# Patient Record
Sex: Female | Born: 1960 | Race: White | Hispanic: No | Smoking: Never smoker
Health system: Southern US, Community
[De-identification: ages and names within clinical notes are randomized; demographics above are authoritative.]

## PROBLEM LIST (undated history)

## (undated) DIAGNOSIS — I10 Essential (primary) hypertension: Secondary | ICD-10-CM

## (undated) DIAGNOSIS — J45909 Unspecified asthma, uncomplicated: Secondary | ICD-10-CM

## (undated) DIAGNOSIS — H348192 Central retinal vein occlusion, unspecified eye, stable: Secondary | ICD-10-CM

## (undated) HISTORY — PX: BACK SURGERY: SHX140

## (undated) HISTORY — PX: KNEE SURGERY: SHX244

## (undated) HISTORY — PX: ABDOMINAL HYSTERECTOMY: SHX81

---

## 1998-04-19 ENCOUNTER — Ambulatory Visit (HOSPITAL_BASED_OUTPATIENT_CLINIC_OR_DEPARTMENT_OTHER): Admission: RE | Admit: 1998-04-19 | Discharge: 1998-04-19 | Payer: Self-pay | Admitting: *Deleted

## 1998-07-29 ENCOUNTER — Other Ambulatory Visit: Admission: RE | Admit: 1998-07-29 | Discharge: 1998-07-29 | Payer: Self-pay | Admitting: *Deleted

## 1999-09-22 ENCOUNTER — Other Ambulatory Visit: Admission: RE | Admit: 1999-09-22 | Discharge: 1999-09-22 | Payer: Self-pay | Admitting: *Deleted

## 2000-10-26 ENCOUNTER — Other Ambulatory Visit: Admission: RE | Admit: 2000-10-26 | Discharge: 2000-10-26 | Payer: Self-pay | Admitting: *Deleted

## 2001-10-27 ENCOUNTER — Other Ambulatory Visit: Admission: RE | Admit: 2001-10-27 | Discharge: 2001-10-27 | Payer: Self-pay | Admitting: Gynecology

## 2002-02-14 ENCOUNTER — Encounter: Admission: RE | Admit: 2002-02-14 | Discharge: 2002-02-14 | Payer: Self-pay | Admitting: Internal Medicine

## 2002-02-14 ENCOUNTER — Encounter: Payer: Self-pay | Admitting: Internal Medicine

## 2002-02-16 ENCOUNTER — Encounter: Payer: Self-pay | Admitting: Internal Medicine

## 2002-02-16 ENCOUNTER — Encounter: Admission: RE | Admit: 2002-02-16 | Discharge: 2002-02-16 | Payer: Self-pay | Admitting: Internal Medicine

## 2002-02-22 ENCOUNTER — Encounter: Payer: Self-pay | Admitting: Neurosurgery

## 2002-02-22 ENCOUNTER — Ambulatory Visit (HOSPITAL_COMMUNITY): Admission: RE | Admit: 2002-02-22 | Discharge: 2002-02-23 | Payer: Self-pay | Admitting: Neurosurgery

## 2002-10-31 ENCOUNTER — Other Ambulatory Visit: Admission: RE | Admit: 2002-10-31 | Discharge: 2002-10-31 | Payer: Self-pay | Admitting: *Deleted

## 2003-10-29 ENCOUNTER — Other Ambulatory Visit: Admission: RE | Admit: 2003-10-29 | Discharge: 2003-10-29 | Payer: Self-pay | Admitting: *Deleted

## 2003-12-31 ENCOUNTER — Other Ambulatory Visit: Admission: RE | Admit: 2003-12-31 | Discharge: 2003-12-31 | Payer: Self-pay | Admitting: *Deleted

## 2004-07-16 ENCOUNTER — Other Ambulatory Visit: Admission: RE | Admit: 2004-07-16 | Discharge: 2004-07-16 | Payer: Self-pay | Admitting: *Deleted

## 2005-06-25 ENCOUNTER — Encounter: Admission: RE | Admit: 2005-06-25 | Discharge: 2005-06-25 | Payer: Self-pay | Admitting: Internal Medicine

## 2005-08-24 ENCOUNTER — Other Ambulatory Visit: Admission: RE | Admit: 2005-08-24 | Discharge: 2005-08-24 | Payer: Self-pay | Admitting: *Deleted

## 2005-11-16 ENCOUNTER — Other Ambulatory Visit: Admission: RE | Admit: 2005-11-16 | Discharge: 2005-11-16 | Payer: Self-pay | Admitting: *Deleted

## 2005-12-11 ENCOUNTER — Ambulatory Visit (HOSPITAL_COMMUNITY): Admission: RE | Admit: 2005-12-11 | Discharge: 2005-12-11 | Payer: Self-pay | Admitting: Gastroenterology

## 2016-03-11 ENCOUNTER — Ambulatory Visit (INDEPENDENT_AMBULATORY_CARE_PROVIDER_SITE_OTHER): Payer: 59

## 2016-03-11 ENCOUNTER — Encounter: Payer: Self-pay | Admitting: Podiatry

## 2016-03-11 ENCOUNTER — Ambulatory Visit (INDEPENDENT_AMBULATORY_CARE_PROVIDER_SITE_OTHER): Payer: 59 | Admitting: Podiatry

## 2016-03-11 VITALS — BP 131/87 | HR 72 | Resp 12

## 2016-03-11 DIAGNOSIS — M205X1 Other deformities of toe(s) (acquired), right foot: Secondary | ICD-10-CM

## 2016-03-11 DIAGNOSIS — M79671 Pain in right foot: Secondary | ICD-10-CM

## 2016-03-11 DIAGNOSIS — G588 Other specified mononeuropathies: Secondary | ICD-10-CM

## 2016-03-11 DIAGNOSIS — G5731 Lesion of lateral popliteal nerve, right lower limb: Secondary | ICD-10-CM | POA: Diagnosis not present

## 2016-03-11 MED ORDER — DEXAMETHASONE SODIUM PHOSPHATE 120 MG/30ML IJ SOLN
4.0000 mg | Freq: Once | INTRAMUSCULAR | Status: AC
  Administered 2016-03-11: 4 mg via INTRA_ARTICULAR

## 2016-03-11 NOTE — Patient Instructions (Signed)
The great toe joint has beginning arthritic changes and we are going to use a custom foot orthotic with a turf toe extension to reduce the flexion extension of the great toe joint. The local nerve in or around a great toe joint was injected with a local steroid to see if it will reduce the shooting pain in the area Will contact you when we have the orthotics are returned from the lab   Hallux Rigidus Hallux rigidus is a condition involving pain and a loss of motion of the first (big) toe. The pain gets worse with lifting up (extension) of the toe. This is usually due to arthritic bony bumps (spurring) of the joint at the base of the big toe.  SYMPTOMS   Pain, with lifting up of the toe.  Tenderness over the joint where the big toe meets the foot.  Redness, swelling, and warmth over the top of the base of the big toe (sometimes).  Foot pain, stiffness, and limping. CAUSES  Hallux rigidus is caused by arthritis of the joint where the big toe meets the foot. The arthritis creates a bone spur that pinches the soft tissues when the toe is extended. RISK INCREASES WITH:  Tight shoes with a narrow toe box.  Family history of foot problems.  Gout and rheumatoid and psoriatic arthritis.  History of previous toe injury, including "turf toe."  Long first toe, flat feet, and other big toe bony bumps.  Arthritis of the big toe. PREVENTION   Wear wide-toed shoes that fit well.  Tape the big toe to reduce motion and to prevent pinching of the tissues between the bone.  Maintain physical fitness:  Foot and ankle flexibility.  Muscle strength and endurance. PROGNOSIS  This condition can usually be managed with proper treatment. However, surgery is typically required to prevent the problem from recurring.  RELATED COMPLICATIONS  Injury to other areas of the foot or ankle, caused by abnormal walking in an attempt to avoid the pain felt when walking normally. TREATMENT Treatment first  involves stopping the activities that aggravate your symptoms. Ice and medicine can be used to reduce the pain and inflammation. Modifications to shoes may help reduce pain, including wearing stiff-soled shoes, shoes with a wide toe box, inserting a padded donut to relieve pressure on top of the joint, or wearing an arch support. Corticosteroid injections may be given to reduce inflammation. If nonsurgical treatment is unsuccessful, surgery may be needed. Surgical options include removing the arthritic bony spur, cutting a bone in the foot to change the arc of motion (allowing the toe to extend more), or fusion of the joint (eliminating all motion in the joint at the base of the big toe).  MEDICATION   If pain medicine is needed, nonsteroidal anti-inflammatory medicines (aspirin and ibuprofen), or other minor pain relievers (acetaminophen), are often advised.  Do not take pain medicine for 7 days before surgery.  Prescription pain relievers are usually prescribed only after surgery. Use only as directed and only as much as you need.  Ointments for arthritis, applied to the skin, may give some relief.  Injections of corticosteroids may be given to reduce inflammation. HEAT AND COLD  Cold treatment (icing) relieves pain and reduces inflammation. Cold treatment should be applied for 10 to 15 minutes every 2 to 3 hours, and immediately after activity that aggravates your symptoms. Use ice packs or an ice massage.  Heat treatment may be used before performing the stretching and strengthening activities prescribed by  your caregiver, physical therapist, or athletic trainer. Use a heat pack or a warm water soak. SEEK MEDICAL CARE IF:   Symptoms get worse or do not improve in 2 weeks, despite treatment.  After surgery you develop fever, increasing pain, redness, swelling, drainage of fluids, bleeding, or increasing warmth.  New, unexplained symptoms develop. (Drugs used in treatment may produce side  effects.)   This information is not intended to replace advice given to you by your health care provider. Make sure you discuss any questions you have with your health care provider.   Document Released: 09/28/2005 Document Revised: 10/19/2014 Document Reviewed: 01/10/2009 Elsevier Interactive Patient Education [employer]

## 2016-03-11 NOTE — Progress Notes (Signed)
Subjective:    Patient ID: Regina Ramirez, female    DOB: 07-13-1961, 55 y.o.   MRN: [id number]  HPI  E This patient presents today for 2 concerns. She describes a throbbing sensation in or around her right first MPJ area that is activated with weightbearing with flexion extension of the great toe. The throbbing is quite debilitating and patient describes this discomfort often lasting up to about an hour relieved with relative rest and elevation. This throbbing around her great toe joint is proportionate to her physical activity. Intermittently she has sharp episodes of the stinging-like pain that can occur on and off weightbearing in around the dorsal medial first MPJ area not directly related to physical activity or with flexion extension of the joint. Patient has history of turf toe injury some several years ago, however, these symptoms are more recent. Patient has sedentary job and can control the throbbing because of relatively inactivity during work. She adjust her activity to accommodate to the throbbing.  Review of Systems  Musculoskeletal: Positive for back pain, joint swelling and gait problem.       Objective:   Physical Exam  Orientated 3  Vascular: No peripheral edema bilaterally DP and PT pulses 2/4 bilaterally Capillary reflex immediate bilaterally  Neurological: Sensation to 10 g monofilament wire intact 5/5 bilaterally Vibratory sensation reactive bilaterally Ankle reflex equal and reactive bilaterally Palpation dorsal medial first MPJ over superficial peroneal nerve causes dysesthesias ( sharp pain) duplicating patient's sharp discomfort  Musculoskeletal: Right first MPJ has no obvious restriction, however pain upon range of motion dorsi and plantar flexion duplicating patient's discomfort in around the right great toe joint area. 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 I made patient aware that the sharp pain most likely was associated with a local nerve in that area and offered her a local injection of dexamethasone phosphate with the attempt of reducing some of the symptoms. Patient verbally consents to this injection. The skin is prepped with alcohol and Betadine and 4 mg of dexamethasone phosphate and 2.5 mg of plain Sensorcaine were injected along the course of the medial superficial perineal nerve. H&N no difficulty tolerating procedure  I discussed the pain upon range of motion and the first MPJ associated with a relatively long first metatarsal and beginning low-grade osteoarthritis and recommended a orthotic with a turf toe extension to reduce flexion extension the first MPJ. I made patient aware that the discomfort on the first MPJ could become more symptomatic and progressive over time, however,I thought the most conservative beginning treatment would be the accommodative orthotic Patient consents to scan for custom orthotics  Digital scan obtained today for in shell orthotic with extrinsic focused on the rear foot and intrinsic forefoot post with turf toe extension on  right.  Return patient for dispensing of orthotics

## 2016-04-01 ENCOUNTER — Ambulatory Visit: Payer: 59 | Admitting: *Deleted

## 2016-04-01 DIAGNOSIS — M79671 Pain in right foot: Secondary | ICD-10-CM

## 2016-04-01 NOTE — Patient Instructions (Signed)

## 2016-04-01 NOTE — Progress Notes (Signed)
Patient ID: Rollene RotundaElizabeth F Mrozek, female   DOB: 09/04/1961, 55 y.o.   MRN: [id number] Patient presents for orthotic pick up.  Verbal and written break in and wear instructions given.  Patient will follow up in 4 weeks if symptoms worsen or fail to improve.

## 2018-12-25 ENCOUNTER — Encounter: Payer: Self-pay | Admitting: Emergency Medicine

## 2018-12-25 ENCOUNTER — Emergency Department (HOSPITAL_COMMUNITY)
Admission: EM | Admit: 2018-12-25 | Discharge: 2018-12-26 | Disposition: A | Discharge status: Home / Self Care | Payer: 59 | Attending: Emergency Medicine | Admitting: Emergency Medicine

## 2018-12-25 ENCOUNTER — Other Ambulatory Visit: Payer: Self-pay

## 2018-12-25 DIAGNOSIS — M5441 Lumbago with sciatica, right side: Secondary | ICD-10-CM

## 2018-12-25 DIAGNOSIS — M545 Low back pain: Secondary | ICD-10-CM | POA: Diagnosis present

## 2018-12-25 HISTORY — DX: Essential (primary) hypertension: I10

## 2018-12-25 HISTORY — DX: Central retinal vein occlusion, unspecified eye, stable: H34.8192

## 2018-12-25 MED ORDER — KETOROLAC TROMETHAMINE 30 MG/ML IJ SOLN
30.0000 mg | Freq: Once | INTRAMUSCULAR | Status: AC
  Administered 2018-12-25: 30 mg via INTRAVENOUS
  Filled 2018-12-25: qty 1

## 2018-12-25 MED ORDER — METHOCARBAMOL 1000 MG/10ML IJ SOLN
500.0000 mg | Freq: Once | INTRAVENOUS | Status: AC
  Administered 2018-12-26: 500 mg via INTRAVENOUS
  Filled 2018-12-25: qty 5

## 2018-12-25 MED ORDER — HYDROMORPHONE HCL 1 MG/ML IJ SOLN
1.0000 mg | Freq: Once | INTRAMUSCULAR | Status: AC
  Administered 2018-12-25: 1 mg via INTRAVENOUS
  Filled 2018-12-25: qty 1

## 2018-12-25 MED ORDER — DEXAMETHASONE SODIUM PHOSPHATE 10 MG/ML IJ SOLN
10.0000 mg | Freq: Once | INTRAMUSCULAR | Status: AC
  Administered 2018-12-25: 10 mg via INTRAVENOUS
  Filled 2018-12-25: qty 1

## 2018-12-25 MED ORDER — ONDANSETRON HCL 4 MG/2ML IJ SOLN
4.0000 mg | Freq: Once | INTRAMUSCULAR | Status: AC
  Administered 2018-12-25: 4 mg via INTRAVENOUS
  Filled 2018-12-25: qty 2

## 2018-12-25 NOTE — ED Provider Notes (Signed)
[facility] EMERGENCY DEPARTMENT Provider Note   CSN: [id number] Arrival date & time: 12/25/18  2128    History   Chief Complaint Chief Complaint  Patient presents with  . Back Pain    HPI Regina Ramirez is a 58 y.o. female.    58 year old female with a history of hypertension and prior ruptured disc status post back surgery by Dr. Franky Macho ~18 years ago presents to the emergency department for low back pain.  She reports some mild low back discomfort beginning Thursday.  She was passing a computer monitor to a coworker at [employer] when she turned and began to experience severe pain in her low back.  Pain was aggravated with ambulation.  She was able to maneuver herself to the couch where she laid for 1 hour without improvement to her discomfort.  States that pain radiates to her right hip and down her lateral right leg.  She called a friend at 69 who came to help her and called EMS.  She received 100 mcg fentanyl in route with mild improvement.  She has not had any genital or perianal numbness.  No bowel or bladder incontinence, extremity numbness or weakness, recent fevers.  States that her pain feels similar to her prior ruptured disc.  Denies any recent trauma, fall, direct injury to her back.  The history is provided by the patient. No language interpreter was used.  Back Pain    Past Medical History:  Diagnosis Date  . Hypertension   . Retinal vein occlusion     There are no active problems to display for this patient.   Past Surgical History:  Procedure Laterality Date  . BACK SURGERY       OB History   No obstetric history on file.      Home Medications    Prior to Admission medications   Medication Sig Start Date End Date Taking? Authorizing Provider  fexofenadine (ALLEGRA) 180 MG tablet Take by mouth.    [provider]  lisinopril-hydrochlorothiazide (ZESTORETIC) 10-12.5 MG tablet Take by mouth. 02/24/16 02/23/17  [provider]  methocarbamol (ROBAXIN) 500 MG tablet Take 1 tablet (500 mg total) by mouth every 8 (eight) hours as needed for muscle spasms. 12/26/18   Antony Madura, PA-C  oxyCODONE-acetaminophen (PERCOCET/ROXICET) 5-325 MG tablet Take 1-2 tablets by mouth every 6 (six) hours as needed for severe pain. 12/26/18   Antony Madura, PA-C  predniSONE (DELTASONE) 20 MG tablet Take 3 tablets (60 mg total) by mouth daily for 4 days, THEN 2 tablets (40 mg total) daily for 4 days, THEN 1 tablet (20 mg total) daily for 4 days, THEN 0.5 tablets (10 mg total) daily for 4 days. 12/26/18 01/11/19  Antony Madura, PA-C  Triamcinolone Acetonide (NASACORT AQ NA) Place into the nose.    [provider]  Vitamin D, Ergocalciferol, (DRISDOL) 50000 units CAPS capsule Take by mouth. 11/26/15   [provider]    Family History History reviewed. No pertinent family history.  Social History Social History   Tobacco Use  . Smoking status: Never Smoker  . Smokeless tobacco: Never Used  Substance Use Topics  . Alcohol use: No    Alcohol/week: 0.0 standard drinks  . Drug use: No     Allergies   Patient has no known allergies.   Review of Systems Review of Systems  Musculoskeletal: Positive for back pain.  Ten systems reviewed and are negative for acute change, except as noted in the  HPI.    Physical Exam Updated Vital Signs BP (!) 149/98   Pulse 69   Temp 97.9 F (36.6 C) (Oral)   Resp 16   Ht 5' 6.5" (1.689 m)   Wt 89.4 kg   SpO2 100%   BMI 31.32 kg/m   Physical Exam Vitals signs and nursing note reviewed.  Constitutional:      General: She is not in acute distress.    Appearance: She is well-developed. She is not diaphoretic.     Comments: Nontoxic appearing and in NAD  HENT:     Head: Normocephalic and atraumatic.  Eyes:     General: No scleral icterus.    Conjunctiva/sclera: Conjunctivae normal.  Neck:     Musculoskeletal: Normal range of motion.  Cardiovascular:      Rate and Rhythm: Normal rate and regular rhythm.     Pulses: Normal pulses.     Comments: DP pulse 2+ bilaterally Pulmonary:     Effort: Pulmonary effort is normal. No respiratory distress.     Comments: Respirations even and unlabored Musculoskeletal:        General: Tenderness present.     Lumbar back: She exhibits tenderness and pain. She exhibits no bony tenderness and no spasm.       Back:     Comments: Tenderness to palpation of the right lumbosacral paraspinal muscles.  No bony deformities, step-offs, crepitus to the lumbosacral midline.  Positive straight leg raise on the right.  Negative crossed straight leg raise.  Skin:    General: Skin is warm and dry.     Coloration: Skin is not pale.     Findings: No erythema or rash.  Neurological:     Mental Status: She is alert and oriented to person, place, and time.     Comments: Sensation to light touch intact in bilateral lower extremities.  Reflexes equal in BLE.  Psychiatric:        Behavior: Behavior normal.      ED Treatments / Results  Labs (all labs ordered are listed, but only abnormal results are displayed) Labs Reviewed - No data to display  EKG None  Radiology No results found.  Procedures Procedures (including critical care time)  Medications Ordered in ED Medications  ketorolac (TORADOL) 30 MG/ML injection 30 mg (30 mg Intravenous Given 12/25/18 2312)  HYDROmorphone (DILAUDID) injection 1 mg (1 mg Intravenous Given 12/25/18 2312)  ondansetron (ZOFRAN) injection 4 mg (4 mg Intravenous Given 12/25/18 2312)  methocarbamol (ROBAXIN) 500 mg in dextrose 5 % 50 mL IVPB (0 mg Intravenous Stopped 12/26/18 0106)  dexamethasone (DECADRON) injection 10 mg (10 mg Intravenous Given 12/25/18 2312)  oxyCODONE-acetaminophen (PERCOCET/ROXICET) 5-325 MG per tablet 2 tablet (2 tablets Oral Given 12/26/18 0217)    12:12 AM Patient with improved pain at rest. Still aggravated with movement. IV Robaxin infusing.   1:40 AM  Patient with improvement in pain. BP 130/82. HTN due to pain response. Will give 2 percocet for additional pain control and attempt ambulation.  2:53 AM Patient able to ambulate to the bathroom.  Antalgic gait noted, but able to weight-bear with minimal assistance.  2:55 AM [employer] substance database reviewed.  No narcotics prescribed since January 2019.   Initial Impression / Assessment and Plan / ED Course  I have reviewed the triage vital signs and the nursing notes.  Pertinent labs & imaging results that were available during my care of the patient were reviewed by me and considered in my medical decision  making (see chart for details).        58 year old female presents to the emergency department for back pain.  Had acute worsening of symptoms when passing a computer monitor to a coworker.  She had previously been experiencing low back discomfort since Thursday.  Noted to be neurovascularly intact on arrival.  Pain aggravated by position change.  Her symptoms are consistent with sciatica with radicular pain down her right leg.  No red flags or signs concerning for cauda equina.  No indication for emergent imaging at this time.  The patient was medically managed with steroids, NSAIDs, IV Robaxin, and pain medicine.  She has been able to ambulate with some improvement in her pain.  I have advised that she follow-up with neurosurgery on an outpatient basis given her history.  Return precautions discussed and provided.  Patient discharged in stable condition with no unaddressed concerns.   Final Clinical Impressions(s) / ED Diagnoses   Final diagnoses:  Acute right-sided low back pain with right-sided sciatica    ED Discharge Orders         Ordered    methocarbamol (ROBAXIN) 500 MG tablet  Every 8 hours PRN     12/26/18 0257    oxyCODONE-acetaminophen (PERCOCET/ROXICET) 5-325 MG tablet  Every 6 hours PRN     12/26/18 0257    predniSONE (DELTASONE) 20 MG tablet      12/26/18 0257           Antony Madura, PA-C 12/26/18 0430    Charlynne Pander, MD 12/27/18 1122

## 2018-12-25 NOTE — ED Triage Notes (Signed)
Pt presents with onset of lower back pain while picking up a computer monitor at 1815.  Pt reports pain radiates into R hip and down R  Leg.  Pt received fentanyl 100mg  PIV. Pt denies any bowel or bladder incontinence.

## 2018-12-26 MED ORDER — METHOCARBAMOL 500 MG PO TABS: 500.0000 mg | ORAL_TABLET | Freq: Three times a day (TID) | ORAL | 0 refills | Status: DC | PRN

## 2018-12-26 MED ORDER — OXYCODONE-ACETAMINOPHEN 5-325 MG PO TABS
2.0000 | ORAL_TABLET | Freq: Once | ORAL | Status: AC
  Administered 2018-12-26: 2 via ORAL
  Filled 2018-12-26: qty 2

## 2018-12-26 MED ORDER — OXYCODONE-ACETAMINOPHEN 5-325 MG PO TABS: 1.0000 | ORAL_TABLET | Freq: Four times a day (QID) | ORAL | 0 refills | Status: DC | PRN

## 2018-12-26 MED ORDER — PREDNISONE 20 MG PO TABS: ORAL_TABLET | ORAL | 0 refills | Status: AC

## 2018-12-26 NOTE — ED Notes (Signed)
Pt ambulated to the bathroom and back with some pain.

## 2018-12-26 NOTE — Discharge Instructions (Signed)
Avoid strenuous activity and heavy lifting.  Take prednisone as prescribed until finished.  You have been given a prescription for Robaxin for management of muscle spasms as well as Percocet for pain control.  Do not drive or drink alcohol after taking these medications as they may make you drowsy and impair your judgment.  We recommend close follow-up with your primary care doctor as well as follow-up with a neurosurgeon given your history of back surgery and ruptured disc.  You may return to the ED for new or concerning symptoms, especially if you develop incontinence of bladder or bowel function, numbness of your genital region, or fever over 100.1F

## 2018-12-26 NOTE — ED Notes (Signed)
Patient verbalizes understanding of discharge instructions. Opportunity for questioning and answers were provided. Armband removed by staff, pt discharged from ED.  

## 2018-12-29 ENCOUNTER — Other Ambulatory Visit: Payer: Self-pay | Admitting: Family Medicine

## 2018-12-29 DIAGNOSIS — M5441 Lumbago with sciatica, right side: Secondary | ICD-10-CM

## 2019-01-03 ENCOUNTER — Other Ambulatory Visit: Payer: Self-pay

## 2019-01-03 ENCOUNTER — Ambulatory Visit
Admission: RE | Admit: 2019-01-03 | Discharge: 2019-01-03 | Disposition: A | Discharge status: Home / Self Care | Payer: 59 | Source: Ambulatory Visit | Attending: Family Medicine | Admitting: Family Medicine

## 2019-01-03 DIAGNOSIS — M5441 Lumbago with sciatica, right side: Secondary | ICD-10-CM

## 2019-01-10 ENCOUNTER — Other Ambulatory Visit: Payer: Self-pay | Admitting: Family Medicine

## 2019-04-13 ENCOUNTER — Ambulatory Visit
Admission: EM | Admit: 2019-04-13 | Discharge: 2019-04-13 | Disposition: A | Discharge status: Home / Self Care | Payer: 59 | Attending: Physician Assistant | Admitting: Physician Assistant

## 2019-04-13 ENCOUNTER — Ambulatory Visit (INDEPENDENT_AMBULATORY_CARE_PROVIDER_SITE_OTHER): Payer: 59

## 2019-04-13 ENCOUNTER — Other Ambulatory Visit: Payer: Self-pay

## 2019-04-13 DIAGNOSIS — M79672 Pain in left foot: Secondary | ICD-10-CM

## 2019-04-13 DIAGNOSIS — I10 Essential (primary) hypertension: Secondary | ICD-10-CM

## 2019-04-13 NOTE — ED Provider Notes (Signed)
[facility]    CSN: [id number] Arrival date & time: 04/13/19  1553     History   Chief Complaint Chief Complaint  Patient presents with  . Foot Injury    HPI Regina Ramirez is a 58 y.o. female.   58 year old female comes in for few hour history of left foot pain after injury.  States she inverted her ankle.  Has swelling and contusion to the lateral side of the foot.  No pain at rest, pain with weightbearing and dorsiflexion of the foot.  Denies numbness, tingling, radiation of pain.  Has not taken anything for the symptoms.     Past Medical History:  Diagnosis Date  . Hypertension   . Retinal vein occlusion     There are no active problems to display for this patient.   Past Surgical History:  Procedure Laterality Date  . BACK SURGERY      OB History   No obstetric history on file.      Home Medications    Prior to Admission medications   Medication Sig Start Date End Date Taking? Authorizing Provider  fexofenadine (ALLEGRA) 180 MG tablet Take by mouth.    [provider]  lisinopril-hydrochlorothiazide (ZESTORETIC) 10-12.5 MG tablet Take by mouth. 02/24/16 02/23/17  [provider]  methocarbamol (ROBAXIN) 500 MG tablet Take 1 tablet (500 mg total) by mouth every 8 (eight) hours as needed for muscle spasms. 12/26/18   Antony MaduraHumes, Kelly, PA-C  oxyCODONE-acetaminophen (PERCOCET/ROXICET) 5-325 MG tablet Take 1-2 tablets by mouth every 6 (six) hours as needed for severe pain. 12/26/18   Antony MaduraHumes, Kelly, PA-C  Triamcinolone Acetonide (NASACORT AQ NA) Place into the nose.    [provider]  Vitamin D, Ergocalciferol, (DRISDOL) 50000 units CAPS capsule Take by mouth. 11/26/15   [provider]    Family History No family history on file.  Social History Social History   Tobacco Use  . Smoking status: Never Smoker  . Smokeless tobacco: Never Used  Substance Use Topics  . Alcohol use: No    Alcohol/week: 0.0  standard drinks  . Drug use: No     Allergies   Patient has no known allergies.   Review of Systems Review of Systems  Reason unable to perform ROS: See HPI as above.     Physical Exam Triage Vital Signs ED Triage Vitals  Enc Vitals Group     BP 04/13/19 1605 (!) 173/99     Pulse Rate 04/13/19 1605 72     Resp 04/13/19 1605 18     Temp 04/13/19 1605 98.2 F (36.8 C)     Temp Source 04/13/19 1605 Oral     SpO2 04/13/19 1605 97 %     Weight --      Height --      Head Circumference --      Peak Flow --      Pain Score 04/13/19 1606 2     Pain Loc --      Pain Edu? --      Excl. in GC? --    No data found.  Updated Vital Signs BP (!) 173/99 (BP Location: Left Arm)   Pulse 72   Temp 98.2 F (36.8 C) (Oral)   Resp 18   SpO2 97%   Physical Exam Constitutional:      General: She is not in acute distress.    Appearance: She is well-developed. She is not diaphoretic.  HENT:  Head: Normocephalic and atraumatic.  Eyes:     Conjunctiva/sclera: Conjunctivae normal.     Pupils: Pupils are equal, round, and reactive to light.  Musculoskeletal:     Comments: Swelling with contusion along the fourth and fifth MTP of left foot.  No tenderness to palpation of bilateral malleolus.  Tenderness to palpation of proximal fifth MTP.  Tenderness to palpation of distal fourth MTP.  Full range of motion of ankle, decreased dorsiflexion of the ankle.  Normal inversion, eversion, extension.  Strength normal and equal bilaterally.  Sensation intact ankle bilaterally.  Pedal pulse 2+.  Skin:    General: Skin is warm and dry.  Neurological:     Mental Status: She is alert and oriented to person, place, and time.    UC Treatments / Results  Labs (all labs ordered are listed, but only abnormal results are displayed) Labs Reviewed - No data to display  EKG   Radiology Dg Foot Complete Left  Result Date: 04/13/2019 CLINICAL DATA:  Left foot pain. Pain is primarily on the fifth  metatarsal. Bruising noted to the fourth metatarsophalangeal joint. There is a laceration at the fifth metatarsophalangeal joint. EXAM: LEFT FOOT - COMPLETE 3+ VIEW COMPARISON:  None. FINDINGS: There is soft tissue swelling about the foot, most notably about the fifth metatarsophalangeal joint. There is no radiopaque foreign body. There is no acute displaced fracture or dislocation. There is a small osseous fragment at the level of the calcaneus, only visualized on the frontal view. This is favored to represent sequela of an old remote injury. There are a few osseous fragments adjacent to the cuboid, likely secondary to an old remote injury. There are degenerative changes of the first metatarsophalangeal joint. IMPRESSION: Soft tissue swelling about the foot with no evidence of an acute displaced fracture or dislocation. Electronically Signed   By: Constance Holster M.D.   On: 04/13/2019 16:53    Procedures Procedures (including critical care time)  Medications Ordered in UC Medications - No data to display  Initial Impression / Assessment and Plan / UC Course  I have reviewed the triage vital signs and the nursing notes.  Pertinent labs & imaging results that were available during my care of the patient were reviewed by me and considered in my medical decision making (see chart for details).    X-ray negative for fracture or dislocation.  NSAIDs, ice compress, elevation, Ace wrap during activity.  Return precautions given.  Patient expresses understanding and agrees to plan.   Final Clinical Impressions(s) / UC Diagnoses   Final diagnoses:  Left foot pain    ED Prescriptions    None        Ok Edwards, PA-C 04/13/19 1724

## 2019-04-13 NOTE — ED Triage Notes (Signed)
Pt states stepped off her porch wrong, twisting lt foot. Swelling and bruising noted

## 2019-04-13 NOTE — Discharge Instructions (Signed)
X-ray negative for fracture or dislocation.  Ibuprofen 600 to 800 mg 3 times a day.  Ice compress, elevation, Ace wrap during activity.  This can take a few weeks to completely resolve, but should be feeling better each week.  Follow-up with PCP for further evaluation if symptoms not improving.

## 2019-05-10 ENCOUNTER — Encounter: Payer: Self-pay | Admitting: Orthopedic Surgery

## 2019-05-10 ENCOUNTER — Ambulatory Visit: Payer: Self-pay

## 2019-05-10 ENCOUNTER — Ambulatory Visit (INDEPENDENT_AMBULATORY_CARE_PROVIDER_SITE_OTHER): Payer: 59 | Admitting: Orthopedic Surgery

## 2019-05-10 DIAGNOSIS — M25561 Pain in right knee: Secondary | ICD-10-CM

## 2019-05-10 NOTE — Progress Notes (Signed)
Office Visit Note   Patient: Regina Ramirez           Date of Birth: [birth year]           MRN: [id number] Visit Date: 05/10/2019 Requested by: Medicine, [place],  [place] [address] PCP: Medicine, [facility]  Subjective: Chief Complaint  Patient presents with  . Right Knee - Pain    HPI: Regina Ramirez is a 58 y.o. female who presents to the office complaining of R knee numbness.  Patient states that her symptoms began when she had a herniated disc in March 2020.  Subsequently, her entire right leg was numb.  She was seen by Dr. Cyndy Freeze and she had surgery in early April 2020 for a herniated disc.  Patient states that the majority of her numbness and tingling improved the day after surgery but she has been left with 1 area of persistent numbness and tingling that extends from her medial distal thigh to her medial proximal calf.  Patient also notes weakness and states that she does not exactly trust her leg.  Patient denies any pain at all.  Patient states that her weakness has seemed to have been improving since the surgery slowly.              ROS: All systems reviewed are negative as they relate to the chief complaint within the history of present illness.  Patient denies  fevers or chills.   Assessment & Plan: Visit Diagnoses:  1. Right knee pain, unspecified chronicity     Plan: Patient is a 58 year old female who presents following surgery for a herniated disc.  Her symptoms include weakness of the hip flexors and numbness tingling of the medial thigh to calf.  The weakness is most concerning but that has been improving.  Recommended the patient try stationary bike to improve her hip flexor strength.  Discussed the potential that her numbness and tingling may improve with time or may never return.  If her weakness persists, will explore the possibility of a recurrent herniation with an MRI scan  in 2 months.  Patient will follow-up in 2 months with this office.  Patient agrees with plan.  Follow-Up Instructions: No follow-ups on file.   Orders:  Orders Placed This Encounter  Procedures  . XR Knee 1-2 Views Right   No orders of the defined types were placed in this encounter.     Procedures: No procedures performed   Clinical Data: No additional findings.  Objective: Vital Signs: There were no vitals taken for this visit.  Physical Exam:   Constitutional: Patient appears well-developed HEENT:  Head: Normocephalic Eyes:EOM are normal Neck: Normal range of motion Cardiovascular: Normal rate Pulmonary/chest: Effort normal Neurologic: Patient is alert Skin: Skin is warm Psychiatric: Patient has normal mood and affect    Ortho Exam:  No effusion Extensor mechanism intact No TTP over the medial or lateral jointlines, quad tendon, patellar tendon, pes anserinus, patella, tibial tubercle, LCL/MCL insertions Extension to 0 degrees Flexion > 90 degrees Area of reduced sensation of the medial right leg that extends from the distal thigh to the proximal calf.  Full sensation distal to this area.  Full sensation of the posterior and lateral knee. Marked weakness of the right hip flexor and the right quad when compared to the contralateral side.  Equivalent strength bilaterally for dorsiflexion and plantar flexion.   Specialty Comments:  No specialty comments  available.  Imaging: No results found.   PMFS History: There are no active problems to display for this patient.  Past Medical History:  Diagnosis Date  . Hypertension   . Retinal vein occlusion     History reviewed. No pertinent family history.  Past Surgical History:  Procedure Laterality Date  . BACK SURGERY     Social History   Occupational History  . Not on file  Tobacco Use  . Smoking status: Never Smoker  . Smokeless tobacco: Never Used  Substance and Sexual Activity  . Alcohol use: No     Alcohol/week: 0.0 standard drinks  . Drug use: No  . Sexual activity: Not on file

## 2019-07-12 ENCOUNTER — Other Ambulatory Visit: Payer: Self-pay

## 2019-07-12 ENCOUNTER — Encounter: Payer: Self-pay | Admitting: Orthopedic Surgery

## 2019-07-12 ENCOUNTER — Ambulatory Visit (INDEPENDENT_AMBULATORY_CARE_PROVIDER_SITE_OTHER): Payer: 59 | Admitting: Orthopedic Surgery

## 2019-07-12 ENCOUNTER — Ambulatory Visit (INDEPENDENT_AMBULATORY_CARE_PROVIDER_SITE_OTHER): Payer: 59

## 2019-07-12 VITALS — Ht 66.5 in | Wt 197.0 lb

## 2019-07-12 DIAGNOSIS — M545 Low back pain, unspecified: Secondary | ICD-10-CM

## 2019-07-12 DIAGNOSIS — R29898 Other symptoms and signs involving the musculoskeletal system: Secondary | ICD-10-CM

## 2019-07-12 DIAGNOSIS — G8929 Other chronic pain: Secondary | ICD-10-CM | POA: Diagnosis not present

## 2019-07-14 ENCOUNTER — Encounter: Payer: Self-pay | Admitting: Orthopedic Surgery

## 2019-07-14 NOTE — Progress Notes (Signed)
Office Visit Note   Patient: Regina Ramirez           Date of Birth: 1961/09/10           MRN: [id number] Visit Date: 07/12/2019 Requested by: Medicine, [facility] [address] [place],  [place] [address] PCP: Medicine, [facility]  Subjective: Chief Complaint  Patient presents with  . Lower Back - Follow-up    HPI: Regina Ramirez is a 58 y.o. female who presents to the office complaining of right knee numbness and back pain.  Patient returns for 38-month follow-up appointment.  Patient notes continued medial right knee numbness with no improvement over the past 2 months.  She has been walking to improve her strength has a stationary bike was too expensive.  She denies any radicular symptoms.  She does have some weakness but she is functional day-to-day, though she has not lifting anything these days.  Denies any instability of the right leg.               ROS:  All systems reviewed are negative as they relate to the chief complaint within the history of present illness.  Patient denies fevers or chills.  Assessment & Plan: Visit Diagnoses:  1. Chronic low back pain, unspecified back pain laterality, unspecified whether sciatica present   2. Weakness of right lower extremity     Plan: Patient is a 57 year old female who presents following back surgery in April.  Her symptoms have not improved in the past 2 months.  She continues to have weakness of the right leg compared to the left leg on exam.  Her persistent medial right knee numbness has not improved either.  Discussed options available to the patient, including ESI injections.  Ordered MRI of the lumbar spine to evaluate for recurrent disc herniation.  Patient will follow-up after MRI to review results.  Follow-Up Instructions: Return for after MRI.   Orders:  Orders Placed This Encounter  Procedures  . XR Lumbar Spine 2-3 Views  . MR Lumbar Spine w/o contrast    No orders of the defined types were placed in this encounter.     Procedures: No procedures performed   Clinical Data: No additional findings.  Objective: Vital Signs: Ht 5' 6.5" (1.689 m)   Wt 197 lb (89.4 kg)   BMI 31.32 kg/m   Physical Exam:  Constitutional: Patient appears well-developed HEENT:  Head: Normocephalic Eyes:EOM are normal Neck: Normal range of motion Cardiovascular: Normal rate Pulmonary/chest: Effort normal Neurologic: Patient is alert Skin: Skin is warm Psychiatric: Patient has normal mood and affect  Ortho Exam:  Right knee Exam No effusion Extensor mechanism intact No TTP over the medial or lateral jointlines, quad tendon, patellar tendon, pes anserinus, patella, tibial tubercle, LCL/MCL insertions Stable to varus/valgus stresses.  Stable to anterior/posterior drawer Extension to 0 degrees Flexion > 90 degrees Sensation intact throughout the RLE except for the medial knee from the distal femur to the proximal tibia  4/5 motor strength of right hip flexors, quadriceps, dorsiflexion, plantarflexion when compared to the contralateral side  Specialty Comments:  No specialty comments available.  Imaging: No results found.   PMFS History: There are no active problems to display for this patient.  Past Medical History:  Diagnosis Date  . Hypertension   . Retinal vein occlusion     History reviewed. No pertinent family history.  Past Surgical History:  Procedure Laterality Date  . BACK  SURGERY     Social History   Occupational History  . Not on file  Tobacco Use  . Smoking status: Never Smoker  . Smokeless tobacco: Never Used  Substance and Sexual Activity  . Alcohol use: No    Alcohol/week: 0.0 standard drinks  . Drug use: No  . Sexual activity: Not on file

## 2019-08-02 ENCOUNTER — Other Ambulatory Visit: Payer: Self-pay

## 2019-08-02 ENCOUNTER — Ambulatory Visit
Admission: RE | Admit: 2019-08-02 | Discharge: 2019-08-02 | Disposition: A | Discharge status: Home / Self Care | Payer: 59 | Source: Ambulatory Visit | Attending: Orthopedic Surgery | Admitting: Orthopedic Surgery

## 2019-08-02 DIAGNOSIS — G8929 Other chronic pain: Secondary | ICD-10-CM

## 2019-08-02 DIAGNOSIS — M545 Low back pain, unspecified: Secondary | ICD-10-CM

## 2019-08-09 ENCOUNTER — Ambulatory Visit: Payer: 59 | Admitting: Orthopedic Surgery

## 2019-08-11 ENCOUNTER — Ambulatory Visit (INDEPENDENT_AMBULATORY_CARE_PROVIDER_SITE_OTHER): Payer: 59 | Admitting: Orthopedic Surgery

## 2019-08-11 ENCOUNTER — Encounter: Payer: Self-pay | Admitting: Orthopedic Surgery

## 2019-08-11 VITALS — Ht 66.5 in | Wt 190.0 lb

## 2019-08-11 DIAGNOSIS — M5126 Other intervertebral disc displacement, lumbar region: Secondary | ICD-10-CM

## 2019-08-12 ENCOUNTER — Encounter: Payer: Self-pay | Admitting: Orthopedic Surgery

## 2019-08-12 NOTE — Progress Notes (Signed)
Office Visit Note   Patient: Regina Ramirez           Date of Birth: 1960/11/07           MRN: [id number] Visit Date: 08/11/2019 Requested by: Medicine, [place],  [place] [id number] PCP: Medicine, [facility]  Subjective: Chief Complaint  Patient presents with  . Lower Back - Follow-up    MRI results    HPI: Regina Ramirez is a 58 y.o. female who presents to the office complaining of right knee pain and numbness.  Patient returns to the office to discuss lumbar spine MRI results.  She notes continued right medial knee numbness that she has been complained about at her previous visits.  In addition to this she notes in the past 2 weeks she has been experiencing occasional bouts of back pain with radiation into her right buttocks that lasts for about 60 seconds.  She denies any new numbness or tingling.  She been taking ibuprofen as needed for this pain.                ROS:  All systems reviewed are negative as they relate to the chief complaint within the history of present illness.  Patient denies fevers or chills.  Assessment & Plan: Visit Diagnoses:  1. Lumbar disc herniation     Plan: Patient is a 58 year old female who returns to the office to discuss MRI results of the lumbar spine.  MRI of the L-spine reveals a shallow right extraforaminal disc protrusion at L2-L3 that may be affecting the exiting L2 nerve root.  My impression is that this is likely the source of her persistent numbness and may be contributing to her right leg pain.  She is not having any weakness on exam and subjectively feels that her leg is getting stronger.  Recommended patient follow-up with Dr. Ernestina Patches for epidural steroid injections of the right L2-L3 foramen.  Patient agrees with plan and denies taking any blood thinners.  She will follow-up with the office if she has no improvement from 1-2 series of injections with Dr.  Ernestina Patches.  She wants to avoid surgery if possible.  I think this is likely the cause of her symptoms.  Injections would be a very good way to start with this particular problem for [name].  Follow-Up Instructions: No follow-ups on file.   Orders:  Orders Placed This Encounter  Procedures  . Ambulatory referral to Physical Medicine Rehab   No orders of the defined types were placed in this encounter.     Procedures: No procedures performed   Clinical Data: No additional findings.  Objective: Vital Signs: Ht 5' 6.5" (1.689 m)   Wt 190 lb (86.2 kg)   BMI 30.21 kg/m   Physical Exam:  Constitutional: Patient appears well-developed HEENT:  Head: Normocephalic Eyes:EOM are normal Neck: Normal range of motion Cardiovascular: Normal rate Pulmonary/chest: Effort normal Neurologic: Patient is alert Skin: Skin is warm Psychiatric: Patient has normal mood and affect  Ortho Exam:  Right knee Exam Extensor mechanism intact Stable to varus/valgus stresses.  Stable to anterior/posterior drawer Extension to 0 degrees Flexion > 90 degrees  Negative straight leg raise bilaterally 5/5 motor strength of the bilateral hip flexor, quadriceps, dorsiflexion, plantarflexion, EHL Reduced sensation of the medial aspect of the right knee that correlates with the L2 and L3 dermatomes Preserved sensation through the rest of the right leg and the entirety  of the left leg  Specialty Comments:  No specialty comments available.  Imaging: No results found.   PMFS History: There are no active problems to display for this patient.  Past Medical History:  Diagnosis Date  . Hypertension   . Retinal vein occlusion     History reviewed. No pertinent family history.  Past Surgical History:  Procedure Laterality Date  . BACK SURGERY     Social History   Occupational History  . Not on file  Tobacco Use  . Smoking status: Never Smoker  . Smokeless tobacco: Never Used  Substance and Sexual  Activity  . Alcohol use: No    Alcohol/week: 0.0 standard drinks  . Drug use: No  . Sexual activity: Not on file

## 2019-08-14 ENCOUNTER — Telehealth: Payer: Self-pay | Admitting: *Deleted

## 2019-08-22 ENCOUNTER — Other Ambulatory Visit: Payer: Self-pay | Admitting: Physical Medicine and Rehabilitation

## 2019-08-22 DIAGNOSIS — F411 Generalized anxiety disorder: Secondary | ICD-10-CM

## 2019-08-22 MED ORDER — DIAZEPAM 5 MG PO TABS: ORAL_TABLET | ORAL | 0 refills | Status: AC

## 2019-08-22 NOTE — Telephone Encounter (Signed)
done

## 2019-08-22 NOTE — Progress Notes (Signed)
Pre-procedure diazepam ordered for pre-operative anxiety.  

## 2019-08-31 ENCOUNTER — Ambulatory Visit: Payer: Self-pay

## 2019-08-31 ENCOUNTER — Other Ambulatory Visit: Payer: Self-pay

## 2019-08-31 ENCOUNTER — Ambulatory Visit (INDEPENDENT_AMBULATORY_CARE_PROVIDER_SITE_OTHER): Payer: 59 | Admitting: Physical Medicine and Rehabilitation

## 2019-08-31 ENCOUNTER — Encounter: Payer: Self-pay | Admitting: Physical Medicine and Rehabilitation

## 2019-08-31 VITALS — BP 133/84 | HR 81

## 2019-08-31 DIAGNOSIS — M5116 Intervertebral disc disorders with radiculopathy, lumbar region: Secondary | ICD-10-CM

## 2019-08-31 MED ORDER — BETAMETHASONE SOD PHOS & ACET 6 (3-3) MG/ML IJ SUSP
12.0000 mg | Freq: Once | INTRAMUSCULAR | Status: AC
  Administered 2019-08-31: 12 mg

## 2019-08-31 NOTE — Progress Notes (Signed)
 .  Numeric Pain Rating Scale and Functional Assessment Average Pain 0   In the last MONTH (on 0-10 scale) has pain interfered with the following?  1. General activity like being  able to carry out your everyday physical activities such as walking, climbing stairs, carrying groceries, or moving a chair?  Rating(8   33)   +Driver, -BT, -Dye Allergies.

## 2019-09-13 ENCOUNTER — Telehealth: Payer: Self-pay | Admitting: Physical Medicine and Rehabilitation

## 2019-09-13 NOTE — Telephone Encounter (Signed)
If no improvement at all she might to follow up with surgeon. Shot looked perfect on images. Consider shot but not likely to help if no relief at all.

## 2019-09-14 ENCOUNTER — Telehealth: Payer: Self-pay

## 2019-09-14 NOTE — Telephone Encounter (Signed)
Patient has severe pain and has worsened with injection with Dr Ernestina Patches. Needs appt ASAP. Dr Ernestina Patches is not here and made appt with Marlou Sa.

## 2019-09-14 NOTE — Telephone Encounter (Signed)
I called patient to advise. She states that you told her she might need an injection at a lower level- "in my lower back" per patient. Please advise.

## 2019-09-15 ENCOUNTER — Other Ambulatory Visit: Payer: Self-pay

## 2019-09-15 ENCOUNTER — Ambulatory Visit (INDEPENDENT_AMBULATORY_CARE_PROVIDER_SITE_OTHER): Payer: 59 | Admitting: Orthopedic Surgery

## 2019-09-15 DIAGNOSIS — M5126 Other intervertebral disc displacement, lumbar region: Secondary | ICD-10-CM | POA: Diagnosis not present

## 2019-09-15 MED ORDER — PREDNISONE 5 MG (21) PO TBPK: ORAL_TABLET | ORAL | 0 refills | Status: AC

## 2019-09-15 MED ORDER — TRAMADOL HCL 50 MG PO TABS: 50.0000 mg | ORAL_TABLET | Freq: Three times a day (TID) | ORAL | 0 refills | Status: AC | PRN

## 2019-09-16 ENCOUNTER — Encounter: Payer: Self-pay | Admitting: Orthopedic Surgery

## 2019-09-16 NOTE — Progress Notes (Signed)
Office Visit Note   Patient: Regina Ramirez           Date of Birth: 11/08/1960           MRN: [id number] Visit Date: 09/15/2019 Requested by: Medicine, [facility] [address] [place],  [place] [address] PCP: Medicine, [facility]  Subjective: Chief Complaint  Patient presents with  . Lower Back - Pain    HPI: Regina Ramirez presents for follow-up of back pain.  She was having some right leg numbness likely coming from some foraminal compression around L3 region.  She had an injection with Dr. Alvester Morin which did not help much.  However she describes 2 days ago having significant increase in pain with severe pain while cooking.  Pain radiates down the right leg.  She had back surgery in April of this year with Dr. Franky Macho.  Pain is radiating down to the foot.  She has been taking Flexeril and Tylenol.  She describes subjective weakness in ambulation with that right leg.              ROS: All systems reviewed are negative as they relate to the chief complaint within the history of present illness.  Patient denies  fevers or chills.   Assessment & Plan: Visit Diagnoses:  1. Lumbar disc herniation     Plan: Impression is low back pain increased in severity acutely with right-sided radicular symptoms and slight weakness with dorsiflexion and positive straight leg raise.  Plan is refer to Dr. Alvester Morin for epidural steroid injection in the location of his choice.  Medrol Dosepak.  Ultram.  Refer to Dr. Franky Macho for further management of this back pain which is postsurgical in nature.  May need CT myelogram for better delineation of the pathology present.  Follow-Up Instructions: No follow-ups on file.   Orders:  Orders Placed This Encounter  Procedures  . Ambulatory referral to Physical Medicine Rehab  . Ambulatory referral to Neurosurgery   Meds ordered this encounter  Medications  . predniSONE (STERAPRED UNI-PAK 21 TAB) 5 MG (21) TBPK  tablet    Sig: Take dosepak as directed    Dispense:  21 tablet    Refill:  0  . traMADol (ULTRAM) 50 MG tablet    Sig: Take 1 tablet (50 mg total) by mouth every 8 (eight) hours as needed.    Dispense:  40 tablet    Refill:  0      Procedures: No procedures performed   Clinical Data: No additional findings.  Objective: Vital Signs: There were no vitals taken for this visit.  Physical Exam:   Constitutional: Patient appears well-developed HEENT:  Head: Normocephalic Eyes:EOM are normal Neck: Normal range of motion Cardiovascular: Normal rate Pulmonary/chest: Effort normal Neurologic: Patient is alert Skin: Skin is warm Psychiatric: Patient has normal mood and affect    Ortho Exam: Ortho exam demonstrates full active and passive range of motion of the hips.  She does have a little weakness with ankle dorsiflexion on the right compared to the left at 5- out of 5 compared to 5 out of 5.  Positive nerve root tension signs on the right negative on the left.  Pedal pulses palpable.  No groin pain with internal X rotation of the leg.  Knee and ankle examination on the right within normal limits in terms of range of motion and crepitus.  Does have some paresthesias in the L3 distribution right versus left.  No  muscle atrophy in the right leg versus left.  Reflexes generally symmetric.  Negative clonus.  Specialty Comments:  No specialty comments available.  Imaging: No results found.   PMFS History: There are no active problems to display for this patient.  Past Medical History:  Diagnosis Date  . Hypertension   . Retinal vein occlusion     History reviewed. No pertinent family history.  Past Surgical History:  Procedure Laterality Date  . BACK SURGERY     Social History   Occupational History  . Not on file  Tobacco Use  . Smoking status: Never Smoker  . Smokeless tobacco: Never Used  Substance and Sexual Activity  . Alcohol use: No    Alcohol/week: 0.0  standard drinks  . Drug use: No  . Sexual activity: Not on file

## 2019-09-18 NOTE — Telephone Encounter (Signed)
Can try right L5 or S1 transforaminal injection

## 2019-09-19 NOTE — Telephone Encounter (Signed)
Scheduled for 12/23 at 1530 with driver.

## 2019-09-26 ENCOUNTER — Telehealth: Payer: Self-pay | Admitting: Orthopedic Surgery

## 2019-09-26 NOTE — Telephone Encounter (Signed)
Can you please fax them info.

## 2019-09-26 NOTE — Telephone Encounter (Signed)
Patient called stating that Dr. Christella Noa office did not receive the referral from our office.  Patient did get an appointment, but needs our records to be sent over the office and also the MRI results.  MM#[phone].  Thank you.

## 2019-09-26 NOTE — Telephone Encounter (Signed)
DONE  To:               Recipient at [phone] Subject:          SECURE: Referral Result:           The transmission was successful. Explanation:      All Pages Ok Pages Sent:       12 Connect Time:     3 minutes, 24 seconds Transmit Time:    09/26/2019 14:21

## 2019-10-04 ENCOUNTER — Encounter: Payer: Self-pay | Admitting: Physical Medicine and Rehabilitation

## 2019-10-04 ENCOUNTER — Ambulatory Visit: Payer: Self-pay

## 2019-10-04 ENCOUNTER — Other Ambulatory Visit: Payer: Self-pay

## 2019-10-04 ENCOUNTER — Ambulatory Visit (INDEPENDENT_AMBULATORY_CARE_PROVIDER_SITE_OTHER): Payer: 59 | Admitting: Physical Medicine and Rehabilitation

## 2019-10-04 VITALS — BP 138/88 | HR 79

## 2019-10-04 DIAGNOSIS — M5116 Intervertebral disc disorders with radiculopathy, lumbar region: Secondary | ICD-10-CM | POA: Diagnosis not present

## 2019-10-04 MED ORDER — METHYLPREDNISOLONE ACETATE 80 MG/ML IJ SUSP
40.0000 mg | Freq: Once | INTRAMUSCULAR | Status: AC
  Administered 2019-10-04: 40 mg

## 2019-10-04 NOTE — Progress Notes (Signed)
 .  Numeric Pain Rating Scale and Functional Assessment Average Pain 0   In the last MONTH (on 0-10 scale) has pain interfered with the following?  1. General activity like being  able to carry out your everyday physical activities such as walking, climbing stairs, carrying groceries, or moving a chair?  Rating(7)   +Driver, -BT, -Dye Allergies.  

## 2020-01-08 NOTE — Progress Notes (Signed)
Regina Ramirez - 59 y.o. female MRN [id number]  Date of birth: 1960-12-17  Office Visit Note: Visit Date: 10/04/2019 PCP: Medicine, [facility] Referred by: Medicine, [facility]*  Subjective: Chief Complaint  Patient presents with  . Right Lower Leg - Numbness   HPI: Catlyn Ramirez is a 59 y.o. female who comes in today For planned right L3 transforaminal epidural steroid injection.  Prior L2 injection was not very beneficial for her right thigh pain and numbness.  By way of quick review she is followed from an orthopedic standpoint by Dr. Burnard Bunting.  She has had lumbar surgery at L2-3 for discectomy and resection of foraminal lateral recess disc herniation.  MRI does show foraminal protrusion at L2-3 but also shows some granulation tissue along the lateral recess that could affect the L3 nerve root.  The prior L2 injection when reviewed on fluoroscopic imaging is excellent placement and without much relief at all I think this may be more of an L3 issue.  We will complete L3 transforaminal injection today diagnostically hopefully therapeutically.  Would recommend follow-up with prior spine surgeon potentially or could look at spinal cord stimulator trial.  ROS Otherwise per HPI.  Assessment & Plan: Visit Diagnoses:  1. Radiculopathy due to lumbar intervertebral disc disorder     Plan: No additional findings.   Meds & Orders:  Meds ordered this encounter  Medications  . methylPREDNISolone acetate (DEPO-MEDROL) injection 40 mg    Orders Placed This Encounter  Procedures  . XR C-ARM NO REPORT  . Epidural Steroid injection    Follow-up: Return if symptoms worsen or fail to improve.   Procedures: No procedures performed  Lumbosacral Transforaminal Epidural Steroid Injection - Sub-Pedicular Approach with Fluoroscopic Guidance  Patient: Regina Ramirez      Date of Birth: 01-02-1961 MRN: [id number] PCP: Medicine, [facility]      Visit Date: 10/04/2019   Universal Protocol:    Date/Time: 10/04/2019  Consent Given By: the patient  Position: PRONE  Additional Comments: Vital signs were monitored before and after the procedure. Patient was prepped and draped in the usual sterile fashion. The correct patient, procedure, and site was verified.   Injection Procedure Details:  Procedure Site One Meds Administered:  Meds ordered this encounter  Medications  . methylPREDNISolone acetate (DEPO-MEDROL) injection 40 mg    Laterality: Right  Location/Site:  L3-L4  Needle size: 22 G  Needle type: Spinal  Needle Placement: Transforaminal  Findings:    -Comments: Excellent flow of contrast along the nerve and into the epidural space.  Procedure Details: After squaring off the end-plates to get a true AP view, the C-arm was positioned so that an oblique view of the foramen as noted above was visualized. The target area is just inferior to the "nose of the scotty dog" or sub pedicular. The soft tissues overlying this structure were infiltrated with 2-3 ml. of 1% Lidocaine without Epinephrine.  The spinal needle was inserted toward the target using a "trajectory" view along the fluoroscope beam.  Under AP and lateral visualization, the needle was advanced so it did not puncture dura and was located close the 6 O'Clock position of the pedical in AP tracterory. Biplanar projections were used to confirm position. Aspiration was confirmed to be negative for CSF and/or blood. A 1-2 ml. volume of Isovue-250 was injected and flow of contrast was noted at each level. Radiographs were obtained for documentation purposes.   After  attaining the desired flow of contrast documented above, a 0.5 to 1.0 ml test dose of 0.25% Marcaine was injected into each respective transforaminal space.  The patient was observed for 90 seconds post injection.  After no sensory deficits were reported, and normal lower extremity motor  function was noted,   the above injectate was administered so that equal amounts of the injectate were placed at each foramen (level) into the transforaminal epidural space.   Additional Comments:  The patient tolerated the procedure well Dressing: 2 x 2 sterile gauze and Band-Aid    Post-procedure details: Patient was observed during the procedure. Post-procedure instructions were reviewed.  Patient left the clinic in stable condition.     Clinical History: MRI LUMBAR SPINE WITHOUT CONTRAST  TECHNIQUE: Multiplanar, multisequence MR imaging of the lumbar spine was performed. No intravenous contrast was administered.  COMPARISON:  Previous MRI from 01/03/2019.  FINDINGS: Segmentation: Standard. Lowest well-formed disc space labeled the L5-S1 level.  Alignment: Physiologic with preservation of the normal lumbar lordosis. No listhesis.  Vertebrae: Vertebral body height maintained without evidence for acute or chronic fracture. Bone marrow signal intensity within normal limits. Few scattered benign hemangiomata noted. No other discrete or worrisome osseous lesions. No abnormal marrow edema.  Conus medullaris and cauda equina: Conus extends to the T12-L1 level. Conus and cauda equina appear normal.  Paraspinal and other soft tissues: Paraspinous soft tissues within normal limits. Visualized visceral structures are normal.  Disc levels:  T11-12 and T12-L1 levels are normal.  L1-2: Unremarkable.  L2-3: Mild annular disc bulge with disc desiccation. Postoperative changes from prior right hemi laminectomy with micro discectomy. Previously seen right subarticular disc extrusion has largely been resected. Soft tissue density within the right lateral recess most likely reflects postoperative granulation tissue, although residual disc material difficult to exclude given lack of IV contrast. There is a persistent superimposed shallow right extraforaminal  disc protrusion, contacting the exiting right L2 nerve root as it courses of the right lateral recess (series 5, image 17), unchanged from previous. Superimposed mild facet hypertrophy. Resultant mild spinal stenosis. Previously seen right lateral recess stenosis is improved. Foramina remain patent.  L3-4: Mild disc bulge with disc desiccation. Superimposed mild to moderate facet and ligament flavum hypertrophy. Resultant mild canal with bilateral lateral recess stenosis, not significantly changed from previous. Mild bilateral L3 foraminal narrowing is relatively unchanged.  L4-5: Mild diffuse disc bulge with disc desiccation. Superimposed shallow central disc protrusion minimally indents the ventral thecal sac. Mild to moderate facet hypertrophy. Resultant mild bilateral lateral recess stenosis, unchanged. Foramina remain patent.  L5-S1: Chronic intervertebral disc space narrowing with disc desiccation. Superimposed small central disc protrusion minimally indents the ventral thecal sac. Associated endplate osseous ridging. Prior left hemi laminectomy with micro discectomy. Probable postoperative granulation tissue surrounds the descending left S1 nerve root in the left lateral recess. Central canal remains patent. No significant foraminal stenosis.  IMPRESSION: 1. Postoperative changes from interval right hemi laminectomy with micro discectomy at L2-3, with resection of previously seen right subarticular disc extrusion. Probable postoperative granulation tissue within the right lateral recess, with no definite recurrent disc herniation. Possible persistence of a small amount of disc material somewhat difficult to exclude given lack of IV contrast. 2. Superimposed shallow right extraforaminal disc protrusion at L2-3, contacting and potentially affecting the exiting right L2 nerve root. 3. Mild disc bulging with facet hypertrophy at L3-4 and L4-5 with resultant mild bilateral  lateral recess stenosis, stable. 4. Chronic postoperative changes at L5-S1 related  to previous left hemi laminectomy and micro discectomy, unchanged.   Electronically Signed   By: Jeannine Boga M.D.   On: 08/02/2019 21:33   She reports that she has never smoked. She has never used smokeless tobacco. No results for input(s): HGBA1C, LABURIC in the last 8760 hours.  Objective:  VS:  HT:    WT:   BMI:     BP:138/88  HR:79bpm  TEMP: ( )  RESP:  Physical Exam  Ortho Exam Imaging: No results found.  Past Medical/Family/Surgical/Social History: Medications & Allergies reviewed per EMR, new medications updated. There are no problems to display for this patient.  Past Medical History:  Diagnosis Date  . Hypertension   . Retinal vein occlusion    History reviewed. No pertinent family history. Past Surgical History:  Procedure Laterality Date  . BACK SURGERY     Social History   Occupational History  . Not on file  Tobacco Use  . Smoking status: Never Smoker  . Smokeless tobacco: Never Used  Substance and Sexual Activity  . Alcohol use: No    Alcohol/week: 0.0 standard drinks  . Drug use: No  . Sexual activity: Not on file

## 2020-01-08 NOTE — Procedures (Signed)
Lumbosacral Transforaminal Epidural Steroid Injection - Sub-Pedicular Approach with Fluoroscopic Guidance  Patient: Regina Ramirez      Date of Birth: [birth year] MRN: [id number] PCP: Medicine, [facility]      Visit Date: 08/31/2019   Universal Protocol:    Date/Time: 08/31/2019  Consent Given By: the patient  Position: PRONE  Additional Comments: Vital signs were monitored before and after the procedure. Patient was prepped and draped in the usual sterile fashion. The correct patient, procedure, and site was verified.   Injection Procedure Details:  Procedure Site One Meds Administered:  Meds ordered this encounter  Medications  . betamethasone acetate-betamethasone sodium phosphate (CELESTONE) injection 12 mg    Laterality: Right  Location/Site:  L2-L3  Needle size: 22 G  Needle type: Spinal  Needle Placement: Transforaminal  Findings:    -Comments: Excellent flow of contrast along the nerve and into the epidural space.  Procedure Details: After squaring off the end-plates to get a true AP view, the C-arm was positioned so that an oblique view of the foramen as noted above was visualized. The target area is just inferior to the "nose of the scotty dog" or sub pedicular. The soft tissues overlying this structure were infiltrated with 2-3 ml. of 1% Lidocaine without Epinephrine.  The spinal needle was inserted toward the target using a "trajectory" view along the fluoroscope beam.  Under AP and lateral visualization, the needle was advanced so it did not puncture dura and was located close the 6 O'Clock position of the pedical in AP tracterory. Biplanar projections were used to confirm position. Aspiration was confirmed to be negative for CSF and/or blood. A 1-2 ml. volume of Isovue-250 was injected and flow of contrast was noted at each level. Radiographs were obtained for documentation purposes.   After attaining the desired flow of contrast  documented above, a 0.5 to 1.0 ml test dose of 0.25% Marcaine was injected into each respective transforaminal space.  The patient was observed for 90 seconds post injection.  After no sensory deficits were reported, and normal lower extremity motor function was noted,   the above injectate was administered so that equal amounts of the injectate were placed at each foramen (level) into the transforaminal epidural space.   Additional Comments:  The patient tolerated the procedure well Dressing: 2 x 2 sterile gauze and Band-Aid    Post-procedure details: Patient was observed during the procedure. Post-procedure instructions were reviewed.  Patient left the clinic in stable condition.

## 2020-01-08 NOTE — Progress Notes (Signed)
Regina Ramirez - 59 y.o. female MRN [id number]  Date of birth: August 06, 1961  Office Visit Note: Visit Date: 08/31/2019 PCP: Medicine, [facility] Referred by: Medicine, [facility]  Subjective: Chief Complaint  Patient presents with  . Right Thigh - Numbness   HPI:  Regina Ramirez is a 59 y.o. female who comes in today For planned right L2 transforaminal epidural steroid injection at the request of G. Alphonzo Severance, MD.  She is having right upper thigh pain and paresthesia.  MRI reviewed.  Failure of conservative care otherwise.  This would be diagnostic and hopefully therapeutic.  ROS Otherwise per HPI.  Assessment & Plan: Visit Diagnoses:  1. Radiculopathy due to lumbar intervertebral disc disorder     Plan: No additional findings.   Meds & Orders:  Meds ordered this encounter  Medications  . betamethasone acetate-betamethasone sodium phosphate (CELESTONE) injection 12 mg    Orders Placed This Encounter  Procedures  . XR C-ARM NO REPORT  . Epidural Steroid injection    Follow-up: No follow-ups on file.   Procedures: No procedures performed  Lumbosacral Transforaminal Epidural Steroid Injection - Sub-Pedicular Approach with Fluoroscopic Guidance  Patient: Regina Ramirez      Date of Birth: [birth year] MRN: [id number] PCP: Medicine, [facility]      Visit Date: 08/31/2019   Universal Protocol:    Date/Time: 08/31/2019  Consent Given By: the patient  Position: PRONE  Additional Comments: Vital signs were monitored before and after the procedure. Patient was prepped and draped in the usual sterile fashion. The correct patient, procedure, and site was verified.   Injection Procedure Details:  Procedure Site One Meds Administered:  Meds ordered this encounter  Medications  . betamethasone acetate-betamethasone sodium phosphate (CELESTONE) injection 12 mg    Laterality: Right  Location/Site:   L2-L3  Needle size: 22 G  Needle type: Spinal  Needle Placement: Transforaminal  Findings:    -Comments: Excellent flow of contrast along the nerve and into the epidural space.  Procedure Details: After squaring off the end-plates to get a true AP view, the C-arm was positioned so that an oblique view of the foramen as noted above was visualized. The target area is just inferior to the "nose of the scotty dog" or sub pedicular. The soft tissues overlying this structure were infiltrated with 2-3 ml. of 1% Lidocaine without Epinephrine.  The spinal needle was inserted toward the target using a "trajectory" view along the fluoroscope beam.  Under AP and lateral visualization, the needle was advanced so it did not puncture dura and was located close the 6 O'Clock position of the pedical in AP tracterory. Biplanar projections were used to confirm position. Aspiration was confirmed to be negative for CSF and/or blood. A 1-2 ml. volume of Isovue-250 was injected and flow of contrast was noted at each level. Radiographs were obtained for documentation purposes.   After attaining the desired flow of contrast documented above, a 0.5 to 1.0 ml test dose of 0.25% Marcaine was injected into each respective transforaminal space.  The patient was observed for 90 seconds post injection.  After no sensory deficits were reported, and normal lower extremity motor function was noted,   the above injectate was administered so that equal amounts of the injectate were placed at each foramen (level) into the transforaminal epidural space.   Additional Comments:  The patient tolerated the procedure well Dressing: 2 x 2 sterile gauze and Band-Aid    Post-procedure  details: Patient was observed during the procedure. Post-procedure instructions were reviewed.  Patient left the clinic in stable condition.     Clinical History: MRI LUMBAR SPINE WITHOUT CONTRAST  TECHNIQUE: Multiplanar, multisequence MR  imaging of the lumbar spine was performed. No intravenous contrast was administered.  COMPARISON:  Previous MRI from 01/03/2019.  FINDINGS: Segmentation: Standard. Lowest well-formed disc space labeled the L5-S1 level.  Alignment: Physiologic with preservation of the normal lumbar lordosis. No listhesis.  Vertebrae: Vertebral body height maintained without evidence for acute or chronic fracture. Bone marrow signal intensity within normal limits. Few scattered benign hemangiomata noted. No other discrete or worrisome osseous lesions. No abnormal marrow edema.  Conus medullaris and cauda equina: Conus extends to the T12-L1 level. Conus and cauda equina appear normal.  Paraspinal and other soft tissues: Paraspinous soft tissues within normal limits. Visualized visceral structures are normal.  Disc levels:  T11-12 and T12-L1 levels are normal.  L1-2: Unremarkable.  L2-3: Mild annular disc bulge with disc desiccation. Postoperative changes from prior right hemi laminectomy with micro discectomy. Previously seen right subarticular disc extrusion has largely been resected. Soft tissue density within the right lateral recess most likely reflects postoperative granulation tissue, although residual disc material difficult to exclude given lack of IV contrast. There is a persistent superimposed shallow right extraforaminal disc protrusion, contacting the exiting right L2 nerve root as it courses of the right lateral recess (series 5, image 17), unchanged from previous. Superimposed mild facet hypertrophy. Resultant mild spinal stenosis. Previously seen right lateral recess stenosis is improved. Foramina remain patent.  L3-4: Mild disc bulge with disc desiccation. Superimposed mild to moderate facet and ligament flavum hypertrophy. Resultant mild canal with bilateral lateral recess stenosis, not significantly changed from previous. Mild bilateral L3 foraminal narrowing is  relatively unchanged.  L4-5: Mild diffuse disc bulge with disc desiccation. Superimposed shallow central disc protrusion minimally indents the ventral thecal sac. Mild to moderate facet hypertrophy. Resultant mild bilateral lateral recess stenosis, unchanged. Foramina remain patent.  L5-S1: Chronic intervertebral disc space narrowing with disc desiccation. Superimposed small central disc protrusion minimally indents the ventral thecal sac. Associated endplate osseous ridging. Prior left hemi laminectomy with micro discectomy. Probable postoperative granulation tissue surrounds the descending left S1 nerve root in the left lateral recess. Central canal remains patent. No significant foraminal stenosis.  IMPRESSION: 1. Postoperative changes from interval right hemi laminectomy with micro discectomy at L2-3, with resection of previously seen right subarticular disc extrusion. Probable postoperative granulation tissue within the right lateral recess, with no definite recurrent disc herniation. Possible persistence of a small amount of disc material somewhat difficult to exclude given lack of IV contrast. 2. Superimposed shallow right extraforaminal disc protrusion at L2-3, contacting and potentially affecting the exiting right L2 nerve root. 3. Mild disc bulging with facet hypertrophy at L3-4 and L4-5 with resultant mild bilateral lateral recess stenosis, stable. 4. Chronic postoperative changes at L5-S1 related to previous left hemi laminectomy and micro discectomy, unchanged.   Electronically Signed   By: Rise Mu M.D.   On: 08/02/2019 21:33     Objective:  VS:  HT:    WT:   BMI:     BP:133/84  HR:81bpm  TEMP: ( )  RESP:  Physical Exam  Ortho Exam Imaging: No results found.

## 2020-01-08 NOTE — Procedures (Signed)
Lumbosacral Transforaminal Epidural Steroid Injection - Sub-Pedicular Approach with Fluoroscopic Guidance  Patient: Regina Ramirez      Date of Birth: 07-Dec-1960 MRN: [id number] PCP: Medicine, [facility]      Visit Date: 10/04/2019   Universal Protocol:    Date/Time: 10/04/2019  Consent Given By: the patient  Position: PRONE  Additional Comments: Vital signs were monitored before and after the procedure. Patient was prepped and draped in the usual sterile fashion. The correct patient, procedure, and site was verified.   Injection Procedure Details:  Procedure Site One Meds Administered:  Meds ordered this encounter  Medications  . methylPREDNISolone acetate (DEPO-MEDROL) injection 40 mg    Laterality: Right  Location/Site:  L3-L4  Needle size: 22 G  Needle type: Spinal  Needle Placement: Transforaminal  Findings:    -Comments: Excellent flow of contrast along the nerve and into the epidural space.  Procedure Details: After squaring off the end-plates to get a true AP view, the C-arm was positioned so that an oblique view of the foramen as noted above was visualized. The target area is just inferior to the "nose of the scotty dog" or sub pedicular. The soft tissues overlying this structure were infiltrated with 2-3 ml. of 1% Lidocaine without Epinephrine.  The spinal needle was inserted toward the target using a "trajectory" view along the fluoroscope beam.  Under AP and lateral visualization, the needle was advanced so it did not puncture dura and was located close the 6 O'Clock position of the pedical in AP tracterory. Biplanar projections were used to confirm position. Aspiration was confirmed to be negative for CSF and/or blood. A 1-2 ml. volume of Isovue-250 was injected and flow of contrast was noted at each level. Radiographs were obtained for documentation purposes.   After attaining the desired flow of contrast documented above, a 0.5 to  1.0 ml test dose of 0.25% Marcaine was injected into each respective transforaminal space.  The patient was observed for 90 seconds post injection.  After no sensory deficits were reported, and normal lower extremity motor function was noted,   the above injectate was administered so that equal amounts of the injectate were placed at each foramen (level) into the transforaminal epidural space.   Additional Comments:  The patient tolerated the procedure well Dressing: 2 x 2 sterile gauze and Band-Aid    Post-procedure details: Patient was observed during the procedure. Post-procedure instructions were reviewed.  Patient left the clinic in stable condition.

## 2020-10-22 IMAGING — DX LEFT FOOT - COMPLETE 3+ VIEW
3 series · 3 of 3 positions shown · non-contrast
Comparison: None.

CLINICAL DATA: Left foot pain. Pain is primarily on the fifth
metatarsal. Bruising noted to the fourth metatarsophalangeal joint.
There is a laceration at the fifth metatarsophalangeal joint.

EXAM:
LEFT FOOT - COMPLETE 3+ VIEW

[foot supine dp]
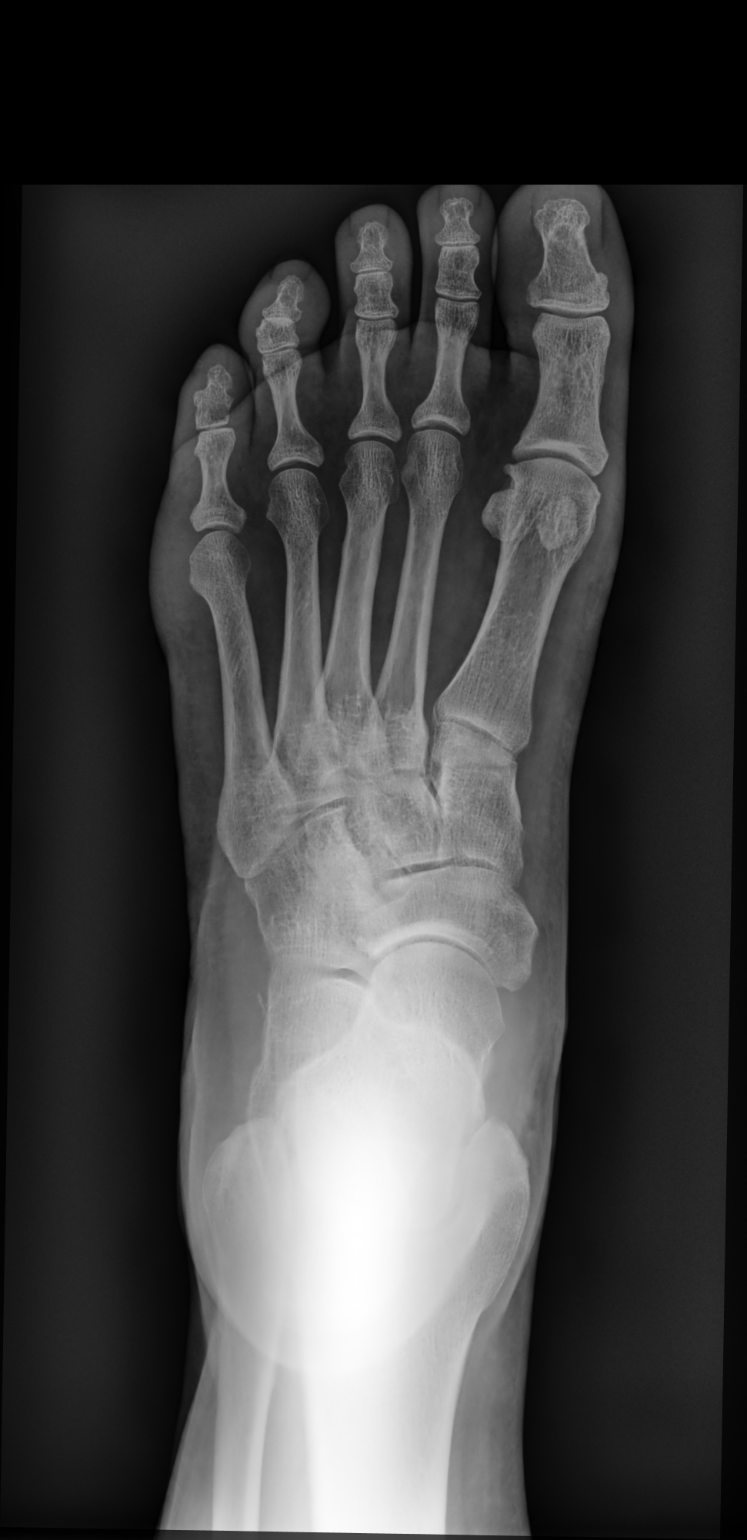

[foot medial oblique]
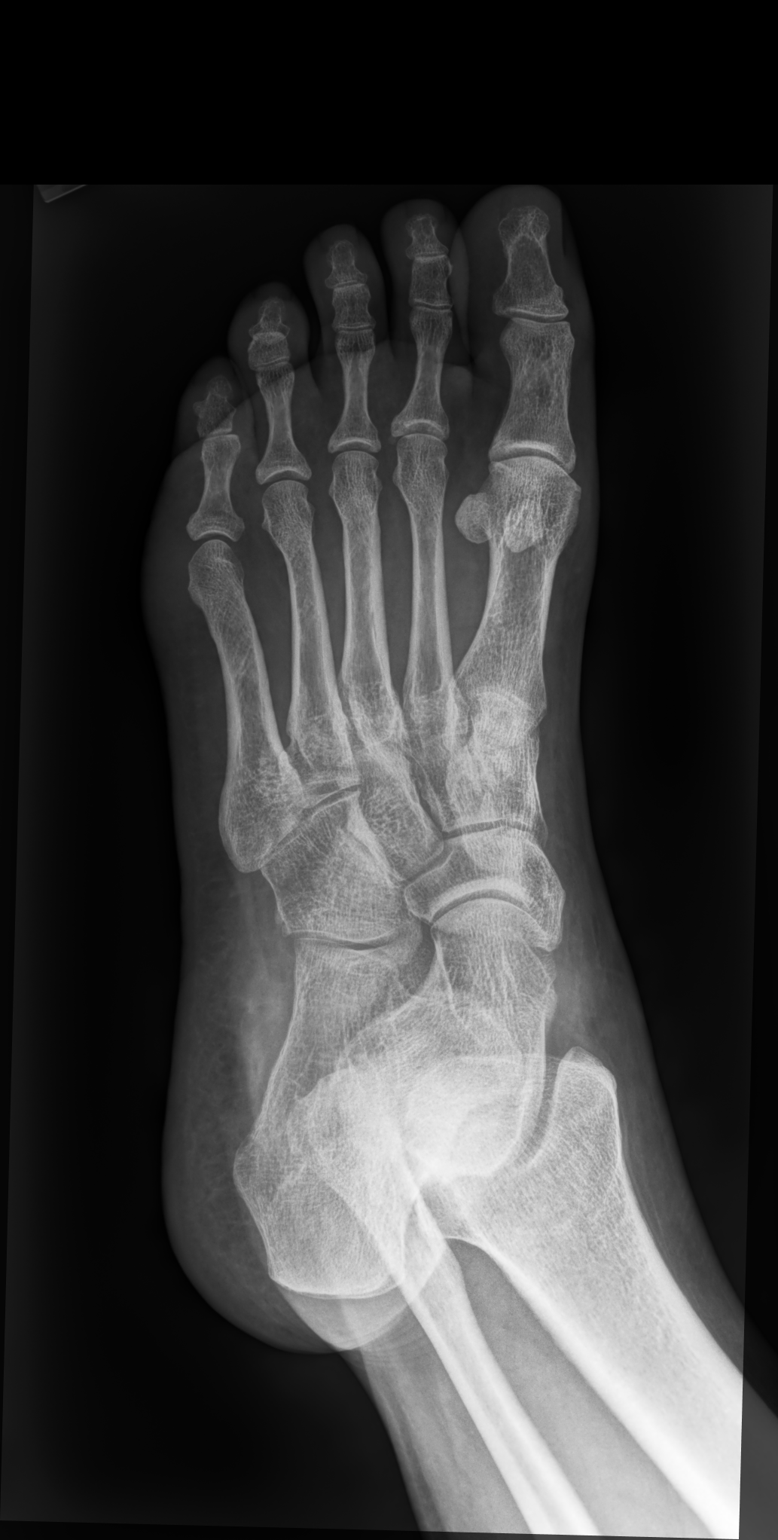

[foot supine lat]
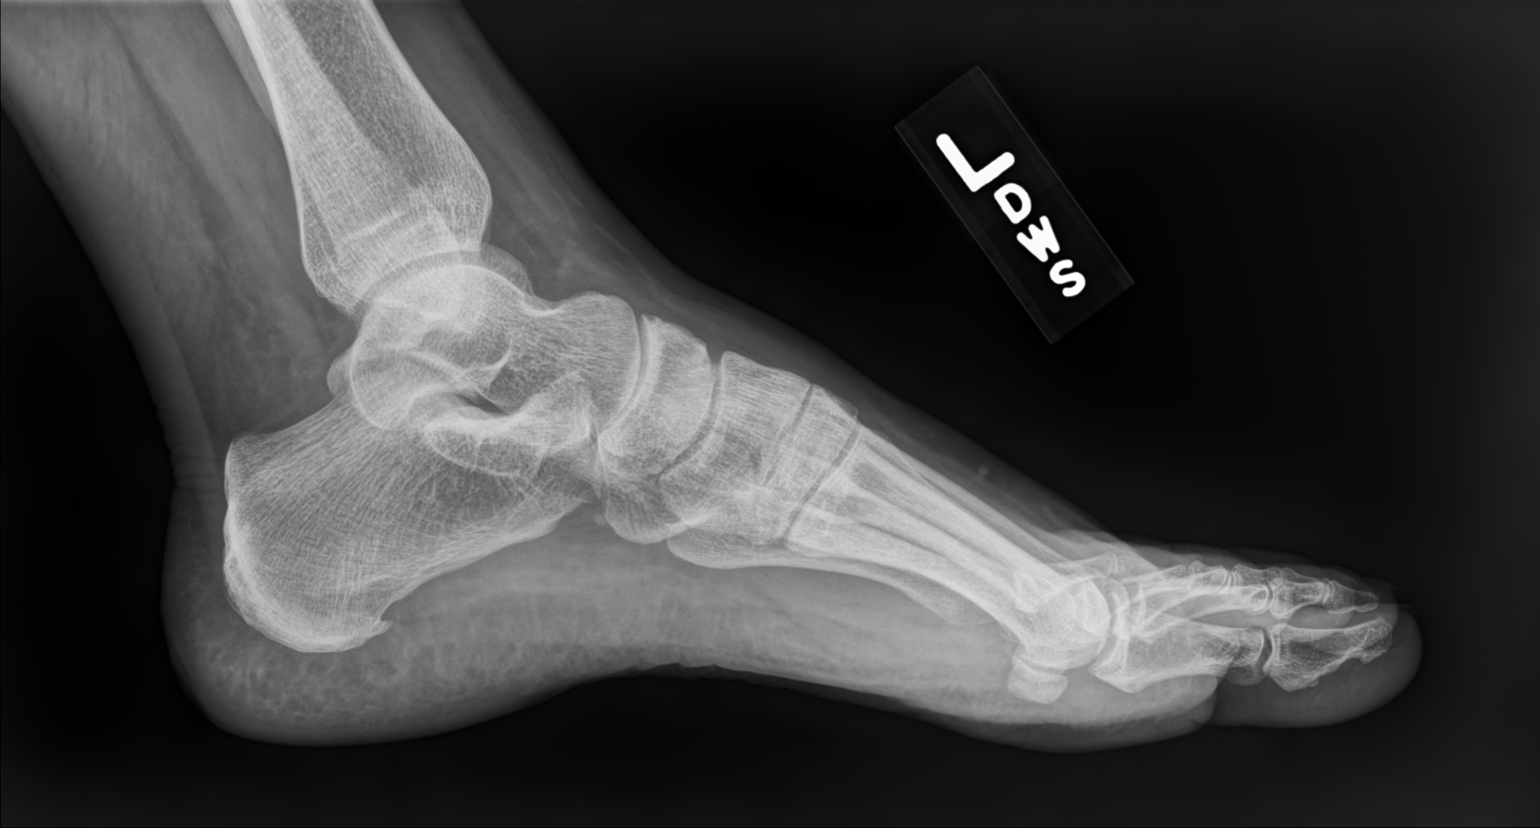

[3 of 3 positions shown; findings below may reference images not displayed]

FINDINGS: There is soft tissue swelling about the foot, most notably about the
fifth metatarsophalangeal joint. There is no radiopaque foreign
body. There is no acute displaced fracture or dislocation. There is
a small osseous fragment at the level of the calcaneus, only
visualized on the frontal view. This is favored to represent sequela
of an old remote injury. There are a few osseous fragments adjacent
to the cuboid, likely secondary to an old remote injury. There are
degenerative changes of the first metatarsophalangeal joint.
IMPRESSION: Soft tissue swelling about the foot with no evidence of an acute
displaced fracture or dislocation.

## 2021-03-29 ENCOUNTER — Ambulatory Visit
Admission: EM | Admit: 2021-03-29 | Discharge: 2021-03-29 | Disposition: A | Discharge status: Home / Self Care | Payer: 59

## 2021-03-29 ENCOUNTER — Other Ambulatory Visit: Payer: Self-pay

## 2021-03-29 ENCOUNTER — Encounter: Payer: Self-pay | Admitting: *Deleted

## 2021-03-29 ENCOUNTER — Ambulatory Visit (INDEPENDENT_AMBULATORY_CARE_PROVIDER_SITE_OTHER): Payer: 59

## 2021-03-29 DIAGNOSIS — W19XXXA Unspecified fall, initial encounter: Secondary | ICD-10-CM | POA: Diagnosis not present

## 2021-03-29 DIAGNOSIS — M79672 Pain in left foot: Secondary | ICD-10-CM | POA: Diagnosis not present

## 2021-03-29 DIAGNOSIS — T07XXXA Unspecified multiple injuries, initial encounter: Secondary | ICD-10-CM

## 2021-03-29 DIAGNOSIS — S93402A Sprain of unspecified ligament of left ankle, initial encounter: Secondary | ICD-10-CM

## 2021-03-29 DIAGNOSIS — M79671 Pain in right foot: Secondary | ICD-10-CM | POA: Diagnosis not present

## 2021-03-29 DIAGNOSIS — S90111A Contusion of right great toe without damage to nail, initial encounter: Secondary | ICD-10-CM | POA: Diagnosis not present

## 2021-03-29 HISTORY — DX: Unspecified asthma, uncomplicated: J45.909

## 2021-03-29 NOTE — Discharge Instructions (Signed)
Wear ace wrap, apply ice to affected area, and take Ibuprofen as needed.  Keep abrasions clean with soap and water, apply antibiotic ointment.  Follow up here or with primary care physician if symptoms worsen.

## 2021-03-29 NOTE — ED Provider Notes (Signed)
[facility]    CSN: [id number] Arrival date & time: 03/29/21  1004      History   Chief Complaint Chief Complaint  Patient presents with   Fall    HPI Regina Ramirez is a 60 y.o. female.   Pt complains of left ankle pain, right great toe pain, and multiple abrasions after she fell yesterday afternoon.  Reports her deck is being renovated and she tripped going down a make shift ramp landing on grass and gravel.  Reports left ankle eversion. Reports she was wearing glasses and her glasses hit the ground when she fell, bruising noted to bridge of nose.  Denies LOC, headache, visual changes, n/v/d.  She has multiple abrasions from the gravel on her right hand and right arm.    Past Medical History:  Diagnosis Date   Asthma    Hypertension    Retinal vein occlusion     There are no problems to display for this patient.   Past Surgical History:  Procedure Laterality Date   ABDOMINAL HYSTERECTOMY     BACK SURGERY     x2   KNEE SURGERY      OB History   No obstetric history on file.      Home Medications    Prior to Admission medications   Medication Sig Start Date End Date Taking? Authorizing Provider  albuterol (VENTOLIN HFA) 108 (90 Base) MCG/ACT inhaler Inhale into the lungs every 6 (six) hours as needed for wheezing or shortness of breath.   Yes [provider]  Budesonide-Formoterol Fumarate (SYMBICORT IN) Inhale into the lungs.   Yes [provider]  fexofenadine (ALLEGRA) 180 MG tablet Take by mouth.   Yes [provider]  predniSONE (STERAPRED UNI-PAK 21 TAB) 5 MG (21) TBPK tablet Take dosepak as directed 09/15/19  Yes Dean, Corrie Mckusick, MD  Vitamin D, Ergocalciferol, (DRISDOL) 50000 units CAPS capsule Take by mouth. 11/26/15  Yes [provider]  diazepam (VALIUM) 5 MG tablet Take 1 by mouth 1 hour  pre-procedure with very light food. May bring 2nd tablet to appointment. 08/22/19   Tyrell Antonio, MD   lisinopril-hydrochlorothiazide (ZESTORETIC) 10-12.5 MG tablet Take by mouth. 02/24/16 02/23/17  [provider]  traMADol (ULTRAM) 50 MG tablet Take 1 tablet (50 mg total) by mouth every 8 (eight) hours as needed. 09/15/19   Cammy Copa, MD  Triamcinolone Acetonide (NASACORT AQ NA) Place into the nose.    [provider]    Family History Family History  Problem Relation Age of Onset   Liver disease Mother    Diabetes Father     Social History Social History   Tobacco Use   Smoking status: Never   Smokeless tobacco: Never  Vaping Use   Vaping Use: Never used  Substance Use Topics   Alcohol use: Yes    Comment: rarely   Drug use: No     Allergies   Patient has no known allergies.   Review of Systems Review of Systems  Constitutional:  Negative for chills and fever.  HENT:  Negative for ear pain and sore throat.   Eyes:  Negative for pain and visual disturbance.  Respiratory:  Negative for cough and shortness of breath.   Cardiovascular:  Negative for chest pain and palpitations.  Gastrointestinal:  Negative for abdominal pain, nausea and vomiting.  Genitourinary:  Negative for dysuria and hematuria.  Musculoskeletal:  Positive for arthralgias (left ankle pain, right great toe pain) and joint  swelling (left ankle swelling). Negative for back pain.  Skin:  Positive for wound (multiple abrasion). Negative for color change and rash.  Neurological:  Negative for seizures, syncope and headaches.  All other systems reviewed and are negative.   Physical Exam Triage Vital Signs ED Triage Vitals  Enc Vitals Group     BP 03/29/21 1019 (!) 164/101     Pulse Rate 03/29/21 1019 80     Resp 03/29/21 1019 18     Temp 03/29/21 1019 98.2 F (36.8 C)     Temp Source 03/29/21 1019 Oral     SpO2 03/29/21 1019 98 %     Weight --      Height --      Head Circumference --      Peak Flow --      Pain Score 03/29/21 1022 10     Pain Loc --      Pain Edu?  --      Excl. in GC? --    No data found.  Updated Vital Signs BP (!) 164/101   Pulse 80   Temp 98.2 F (36.8 C) (Oral)   Resp 18   SpO2 98%   Visual Acuity Right Eye Distance:   Left Eye Distance:   Bilateral Distance:    Right Eye Near:   Left Eye Near:    Bilateral Near:     Physical Exam Vitals and nursing note reviewed.  Constitutional:      General: She is not in acute distress.    Appearance: She is well-developed.  HENT:     Head: Normocephalic and atraumatic.  Eyes:     Conjunctiva/sclera: Conjunctivae normal.  Cardiovascular:     Rate and Rhythm: Normal rate and regular rhythm.     Heart sounds: No murmur heard. Pulmonary:     Effort: Pulmonary effort is normal. No respiratory distress.     Breath sounds: Normal breath sounds.  Abdominal:     Palpations: Abdomen is soft.     Tenderness: There is no abdominal tenderness.  Musculoskeletal:     Cervical back: Neck supple.     Right ankle: Normal.     Left ankle: Swelling present. Tenderness present over the ATF ligament. No lateral malleolus or medial malleolus tenderness. Anterior drawer test negative. Normal pulse.  Skin:    General: Skin is warm and dry.  Neurological:     Mental Status: She is alert.     UC Treatments / Results  Labs (all labs ordered are listed, but only abnormal results are displayed) Labs Reviewed - No data to display  EKG   Radiology DG Foot Complete Left  Result Date: 03/29/2021 CLINICAL DATA:  Fall EXAM: LEFT FOOT - COMPLETE 3+ VIEW COMPARISON:  04/13/2019 FINDINGS: There is no evidence of fracture or dislocation. There is no evidence of arthropathy or other focal bone abnormality. Soft tissues are unremarkable. IMPRESSION: Negative. Electronically Signed   By: Marlan Palau M.D.   On: 03/29/2021 11:28   DG Foot Complete Right  Result Date: 03/29/2021 CLINICAL DATA:  Fall.  Pain EXAM: RIGHT FOOT COMPLETE - 3+ VIEW COMPARISON:  None. FINDINGS: Negative for fracture or  dislocation. Mild degenerative change first MTP joint. No erosive arthropathy. IMPRESSION: Mild degenerative change first MTP.  Negative for fracture. Electronically Signed   By: Marlan Palau M.D.   On: 03/29/2021 11:28    Procedures Procedures (including critical care time)  Medications Ordered in UC Medications - No data to display  Initial Impression / Assessment and Plan / UC Course  I have reviewed the triage vital signs and the nursing notes.  Pertinent labs & imaging results that were available during my care of the patient were reviewed by me and considered in my medical decision making (see chart for details).     Left ankle sprain-ace bandage applied. Images negative.  Weight bear as tolerated.  Rest, ice, elevation.   Right great toe contusion. Images negative. Activity as tolerated.   Advised to keep abrasions clean, apply antibiotic ointment.   Advised to return if sx worsen.  Final Clinical Impressions(s) / UC Diagnoses   Final diagnoses:  Sprain of left ankle, unspecified ligament, initial encounter  Contusion of right great toe without damage to nail, initial encounter  Multiple abrasions     Discharge Instructions      Wear ace wrap, apply ice to affected area, and take Ibuprofen as needed.  Keep abrasions clean with soap and water, apply antibiotic ointment.  Follow up here or with primary care physician if symptoms worsen.    ED Prescriptions   None    PDMP not reviewed this encounter.   Jodell Cipro, PA-C 03/29/21 1246

## 2021-03-29 NOTE — ED Triage Notes (Signed)
Pt reports tripping and stumbling off a low wooden platform yesterday afternoon.  Denies LOC.  Multiple abrasions noted to face, BUE, left knee.  C/O left dorsal lateral foot pain and swelling with difficulty bearing weight.  Also c/o right foot pain at base of right great toe, with difficulty moving toes due to pain.  BLE CMS intact.  Denies vision changes, nausea, or unusual fatigue.

## 2021-06-11 ENCOUNTER — Ambulatory Visit: Payer: Self-pay

## 2021-06-11 ENCOUNTER — Other Ambulatory Visit: Payer: Self-pay

## 2021-06-11 ENCOUNTER — Ambulatory Visit (INDEPENDENT_AMBULATORY_CARE_PROVIDER_SITE_OTHER): Payer: 59 | Admitting: Orthopedic Surgery

## 2021-06-11 DIAGNOSIS — M25572 Pain in left ankle and joints of left foot: Secondary | ICD-10-CM

## 2021-06-13 ENCOUNTER — Encounter: Payer: Self-pay | Admitting: Orthopedic Surgery

## 2021-06-13 NOTE — Progress Notes (Signed)
Office Visit Note   Patient: Regina Ramirez           Date of Birth: 1961/06/26           MRN: [id number] Visit Date: 06/11/2021 Requested by: Medicine, [facility] [place],  [place] [address] PCP: Medicine, [facility]  Subjective: Chief Complaint  Patient presents with   Left Ankle - Pain   Left Foot - Pain    HPI: Regina Ramirez is a 60 y.o. female who presents to the office complaining of left ankle pain.  Patient states that she fell at her home on 03/28/2021.  She sustained an inversion injury to her left ankle that she can recall.  She had immediate swelling with bruising that she noted 2 days later.  She localizes pain to the lateral aspect of the ankle and states that her pain is worse with weightbearing.  She has had minor improvement in her pain but still complains of pain and swelling that depends on her activity.  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  She has tried an ankle sleeve but no lasting improvement.  She had radiographs at the time that were reportedly negative for any fracture as well as a second set of radiographs in early July that were also negative for fracture.  .                ROS: All systems reviewed are negative as they relate to the chief complaint within the history of present illness.  Patient denies fevers or chills.  Assessment & Plan: Visit Diagnoses:  1. Pain in left ankle and joints of left foot     Plan: Patient is a 60 year old female who presents complaining of persistent left ankle pain.  She has had ankle pain since an inversion injury on 03/29/2021 with no improvement of her symptoms.  She had multiple set of radiographs that were reportedly negative for fracture but she does have a little avulsion fracture off of the dorsal aspect of the talus likely indicating damage to the ATFL.  However, unusual that her pain is not improved in  the 2+ months since her initial injury.  With such chronic pain and the fact that is still debilitating for her and making it difficult to live her life and do her job, plan to order MRI of the left ankle for further evaluation of chronic ATFL tear versus peroneal tendon injury.  Also want to look at the anterior tibialis tendon as well though the dorsiflexion limitation may just be due to pain for her.  Patient agreed with this plan.  Follow-up after MRI to review results.  Follow-Up Instructions: No follow-ups on file.   Orders:  Orders Placed This Encounter  Procedures   MR Ankle Left w/o contrast   No orders of the defined types were placed in this encounter.     Procedures: No procedures performed   Clinical Data: No additional findings.  Objective: Vital Signs: There were no vitals taken for this visit.  Physical Exam:  Constitutional: Patient appears well-developed HEENT:  Head: Normocephalic Eyes:EOM are normal Neck: Normal range of motion Cardiovascular: Normal rate Pulmonary/chest: Effort normal Neurologic: Patient is alert Skin: Skin is warm Psychiatric: Patient has normal mood and affect  Ortho Exam: Ortho exam demonstrates left ankle with palpable DP pulse.  Intact dorsiflexion, plantarflexion, inversion, eversion but active dorsiflexion is about  5 to 10 degrees less then active dorsiflexion of the contralateral uninjured ankle.  Testing her eversion strength does not elicit pain in the lateral aspect of the ankle.  She has minor swelling over ATFL that is not present in the right ankle.  Anterior tibialis seems to be palpable but less prominent in the left ankle compared with the right.  No fifth metatarsal base tenderness but she does have some tenderness along the course of the peroneal tendon proximal to the fifth met base.  Tenderness over the CFL and ATFL.  No tenderness over the lateral malleolus, medial malleolus, Achilles tendon, Achilles tendon insertion,  Lisfranc complex, posterior tibialis attachment.  Specialty Comments:  No specialty comments available.  Imaging: No results found.   PMFS History: There are no problems to display for this patient.  Past Medical History:  Diagnosis Date   Asthma    Hypertension    Retinal vein occlusion     Family History  Problem Relation Age of Onset   Liver disease Mother    Diabetes Father     Past Surgical History:  Procedure Laterality Date   ABDOMINAL HYSTERECTOMY     BACK SURGERY     x2   KNEE SURGERY     Social History   Occupational History   Not on file  Tobacco Use   Smoking status: Never   Smokeless tobacco: Never  Vaping Use   Vaping Use: Never used  Substance and Sexual Activity   Alcohol use: Yes    Comment: rarely   Drug use: No   Sexual activity: Not on file

## 2021-06-27 ENCOUNTER — Other Ambulatory Visit: Payer: Self-pay

## 2021-06-27 ENCOUNTER — Ambulatory Visit
Admission: RE | Admit: 2021-06-27 | Discharge: 2021-06-27 | Disposition: A | Discharge status: Home / Self Care | Payer: 59 | Source: Ambulatory Visit | Attending: Orthopedic Surgery | Admitting: Orthopedic Surgery

## 2021-06-27 DIAGNOSIS — M25572 Pain in left ankle and joints of left foot: Secondary | ICD-10-CM

## 2021-07-02 ENCOUNTER — Ambulatory Visit (INDEPENDENT_AMBULATORY_CARE_PROVIDER_SITE_OTHER): Payer: 59 | Admitting: Orthopedic Surgery

## 2021-07-02 ENCOUNTER — Other Ambulatory Visit: Payer: Self-pay

## 2021-07-02 ENCOUNTER — Encounter: Payer: Self-pay | Admitting: Orthopedic Surgery

## 2021-07-02 DIAGNOSIS — M25572 Pain in left ankle and joints of left foot: Secondary | ICD-10-CM

## 2021-07-03 ENCOUNTER — Telehealth: Payer: Self-pay

## 2021-07-03 NOTE — Telephone Encounter (Signed)
Dr August Saucer wants patient to see Dr Lajoyce Corners to eval left ankle anterior process calc fx injury.  Please schedule this appt.

## 2021-07-07 NOTE — Progress Notes (Signed)
Office Visit Note   Patient: Regina Ramirez           Date of Birth: [birth year]           MRN: [id number] Visit Date: 07/02/2021 Requested by: Medicine, [facility] [address] [place],  [place] [address] PCP: Medicine, [facility]  Subjective: Chief Complaint  Patient presents with   Left Ankle - Follow-up    HPI: Regina Ramirez is a 60 year old patient who is now about 3 months out left ankle sprain.  She has not improved.  She reports pain in the midfoot region.  Has had difficulty ambulating.  In general the "ankle sprain" has not really behaved as typical ankle sprains do.  Since she was last seen she has had an MRI scan which does show a lot of edema around the anterior process of the calcaneus.  This is where she localizes her symptoms.  ATFL and deltoid ligaments intact.              ROS: All systems reviewed are negative as they relate to the chief complaint within the history of present illness.  Patient denies  fevers or chills.   Assessment & Plan: Visit Diagnoses:  1. Pain in left ankle and joints of left foot     Plan: Impression is left ankle pain.  She has anterior process calcaneus pathology.  May need either injection or excision.  Would like to refer her to our foot and ankle specialist Dr. Lajoyce Corners for further evaluation and management.  Follow-up with me as needed.  She has not really improved much at all since her initial injury 3 months ago.  Ankle is stable.  Follow-Up Instructions: No follow-ups on file.   Orders:  No orders of the defined types were placed in this encounter.  No orders of the defined types were placed in this encounter.     Procedures: No procedures performed   Clinical Data: No additional findings.  Objective: Vital Signs: There were no vitals taken for this visit.  Physical Exam:   Constitutional: Patient appears well-developed HEENT:  Head: Normocephalic Eyes:EOM are  normal Neck: Normal range of motion Cardiovascular: Normal rate Pulmonary/chest: Effort normal Neurologic: Patient is alert Skin: Skin is warm Psychiatric: Patient has normal mood and affect   Ortho Exam: Ortho exam demonstrates palpable intact nontender anterior to posterior to peroneal and Achilles tendons.  No tenderness at the base of the fifth metatarsal on the left-hand side.  She does have some diminished dorsiflexion plantarflexion range of motion by about 5 to 10 degrees on the left versus right.  Ankle is stable to varus testing and anterior drawer testing left versus right.  Tenderness just distal to the sinus Tarsi region.  No pain with pronation supination of the forefoot.  Specialty Comments:  No specialty comments available.  Imaging: No results found.   PMFS History: There are no problems to display for this patient.  Past Medical History:  Diagnosis Date   Asthma    Hypertension    Retinal vein occlusion     Family History  Problem Relation Age of Onset   Liver disease Mother    Diabetes Father     Past Surgical History:  Procedure Laterality Date   ABDOMINAL HYSTERECTOMY     BACK SURGERY     x2   KNEE SURGERY     Social History   Occupational History   Not on file  Tobacco  Use   Smoking status: Never   Smokeless tobacco: Never  Vaping Use   Vaping Use: Never used  Substance and Sexual Activity   Alcohol use: Yes    Comment: rarely   Drug use: No   Sexual activity: Not on file

## 2021-07-14 ENCOUNTER — Other Ambulatory Visit: Payer: Self-pay

## 2021-07-14 ENCOUNTER — Ambulatory Visit (INDEPENDENT_AMBULATORY_CARE_PROVIDER_SITE_OTHER): Payer: 59 | Admitting: Orthopedic Surgery

## 2021-07-14 DIAGNOSIS — M25572 Pain in left ankle and joints of left foot: Secondary | ICD-10-CM

## 2021-08-02 ENCOUNTER — Encounter: Payer: Self-pay | Admitting: Orthopedic Surgery

## 2021-08-02 NOTE — Progress Notes (Signed)
   Office Visit Note   Patient: Regina Ramirez           Date of Birth: [birth year]           MRN: [id number] Visit Date: 07/14/2021              Requested by: Medicine, [facility] [address] [place],  [place] [address] PCP: Medicine, [facility]  Chief Complaint  Patient presents with   Left Ankle - Pain      HPI: Patient is a 60 year old woman who is seen in referral from Dr. August Saucer.  She is 3 months status post left ankle sprain MRI scan obtained last month.  She is currently wearing a compression sleeve.  Patient states the pain is over the lateral aspect of the ankle.  Assessment & Plan: Visit Diagnoses:  1. Pain in left ankle and joints of left foot     Plan: Patient was placed in an ASO we will see how she progresses with the injection.  Patient may require anterior talofibular ligament reconstruction with ankle arthroscopy.  Follow-Up Instructions: Return in about 3 weeks (around 08/04/2021).   Ortho Exam  Patient is alert, oriented, no adenopathy, well-dressed, normal affect, normal respiratory effort. Examination patient has good pulses she has pain to palpation directly over the anterior talofibular ligament.  The posterior tibial tendon peroneal tendons are not tender to palpation anterior drawer is stable consistent with the opposite ankle.  The MRI scan is reviewed no gross abnormalities.  Imaging: No results found. No images are attached to the encounter.  Labs: No results found for: HGBA1C, ESRSEDRATE, CRP, LABURIC, REPTSTATUS, GRAMSTAIN, CULT, LABORGA   No results found for: ALBUMIN, PREALBUMIN, CBC  No results found for: MG No results found for: VD25OH  No results found for: PREALBUMIN No flowsheet data found.   There is no height or weight on file to calculate BMI.  Orders:  No orders of the defined types were placed in this encounter.  No orders of the defined types were placed in  this encounter.    Procedures: No procedures performed  Clinical Data: No additional findings.  ROS:  All other systems negative, except as noted in the HPI. Review of Systems  Objective: Vital Signs: There were no vitals taken for this visit.  Specialty Comments:  No specialty comments available.  PMFS History: There are no problems to display for this patient.  Past Medical History:  Diagnosis Date   Asthma    Hypertension    Retinal vein occlusion     Family History  Problem Relation Age of Onset   Liver disease Mother    Diabetes Father     Past Surgical History:  Procedure Laterality Date   ABDOMINAL HYSTERECTOMY     BACK SURGERY     x2   KNEE SURGERY     Social History   Occupational History   Not on file  Tobacco Use   Smoking status: Never   Smokeless tobacco: Never  Vaping Use   Vaping Use: Never used  Substance and Sexual Activity   Alcohol use: Yes    Comment: rarely   Drug use: No   Sexual activity: Not on file

## 2021-08-11 ENCOUNTER — Ambulatory Visit (INDEPENDENT_AMBULATORY_CARE_PROVIDER_SITE_OTHER): Payer: 59 | Admitting: Orthopedic Surgery

## 2021-08-11 ENCOUNTER — Other Ambulatory Visit: Payer: Self-pay

## 2021-08-11 ENCOUNTER — Encounter: Payer: Self-pay | Admitting: Orthopedic Surgery

## 2021-08-11 DIAGNOSIS — S93492D Sprain of other ligament of left ankle, subsequent encounter: Secondary | ICD-10-CM | POA: Diagnosis not present

## 2021-08-11 NOTE — Progress Notes (Signed)
   Office Visit Note   Patient: Regina Ramirez           Date of Birth: [birth year]           MRN: [id number] Visit Date: 08/11/2021              Requested by: Medicine, [facility] [address] [place],  [place] [address] PCP: Medicine, [facility]  Chief Complaint  Patient presents with   Left Ankle - Pain, Follow-up      HPI: Patient is a 60 year old woman who presents in follow-up status post sprain anterior talofibular ligament.  Patient states that her ankle feels better she has pain over the peroneal tendons and the anterior talofibular ligament at this time.  Assessment & Plan: Visit Diagnoses:  1. Sprain of anterior talofibular ligament of left ankle, subsequent encounter     Plan: Patient's ankle has regained good stability.  I feel that this should continue to improve.  She will continue with the ASO for a month.  Follow-Up Instructions: Return if symptoms worsen or fail to improve.   Ortho Exam  Patient is alert, oriented, no adenopathy, well-dressed, normal affect, normal respiratory effort. Examination there is some tenderness to palpation of the peroneal tendons but there is no palpable defect no swelling.  She is tender over the anterior talofibular ligament.  Anterior drawer is stable with good improvement in the stability of the left ankle with an anterior drawer.  Imaging: No results found. No images are attached to the encounter.  Labs: No results found for: HGBA1C, ESRSEDRATE, CRP, LABURIC, REPTSTATUS, GRAMSTAIN, CULT, LABORGA   No results found for: ALBUMIN, PREALBUMIN, CBC  No results found for: MG No results found for: VD25OH  No results found for: PREALBUMIN No flowsheet data found.   There is no height or weight on file to calculate BMI.  Orders:  No orders of the defined types were placed in this encounter.  No orders of the defined types were placed in this encounter.     Procedures: No procedures performed  Clinical Data: No additional findings.  ROS:  All other systems negative, except as noted in the HPI. Review of Systems  Objective: Vital Signs: There were no vitals taken for this visit.  Specialty Comments:  No specialty comments available.  PMFS History: There are no problems to display for this patient.  Past Medical History:  Diagnosis Date   Asthma    Hypertension    Retinal vein occlusion     Family History  Problem Relation Age of Onset   Liver disease Mother    Diabetes Father     Past Surgical History:  Procedure Laterality Date   ABDOMINAL HYSTERECTOMY     BACK SURGERY     x2   KNEE SURGERY     Social History   Occupational History   Not on file  Tobacco Use   Smoking status: Never   Smokeless tobacco: Never  Vaping Use   Vaping Use: Never used  Substance and Sexual Activity   Alcohol use: Yes    Comment: rarely   Drug use: No   Sexual activity: Not on file

## 2022-10-08 IMAGING — DX DG FOOT COMPLETE 3+V*L*
3 series · 3 of 3 positions shown · non-contrast
Comparison: 04/13/2019

CLINICAL DATA: Fall

EXAM:
LEFT FOOT - COMPLETE 3+ VIEW

[foot supine dp]
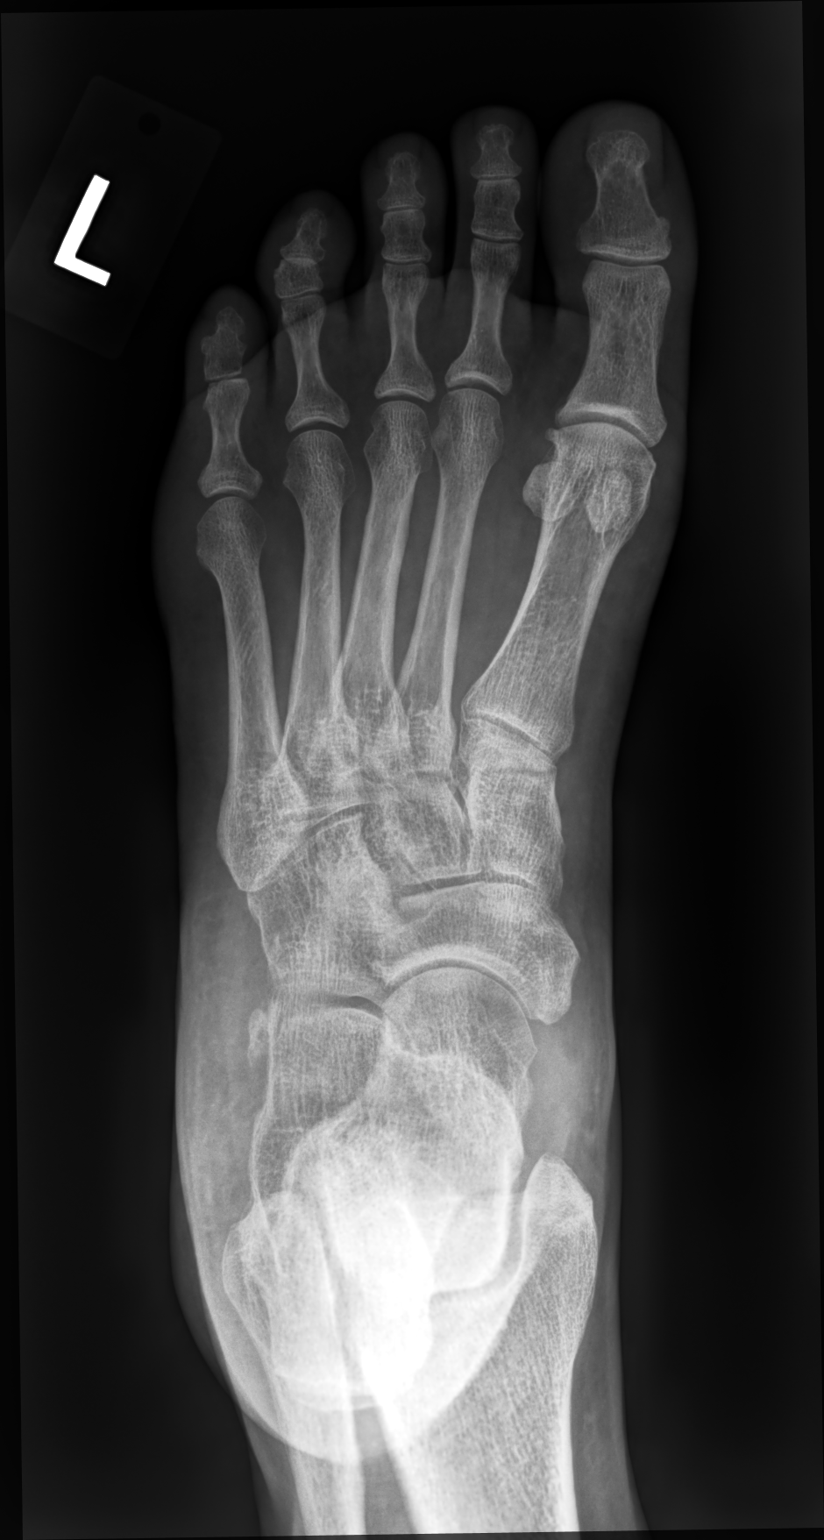

[foot medial oblique]
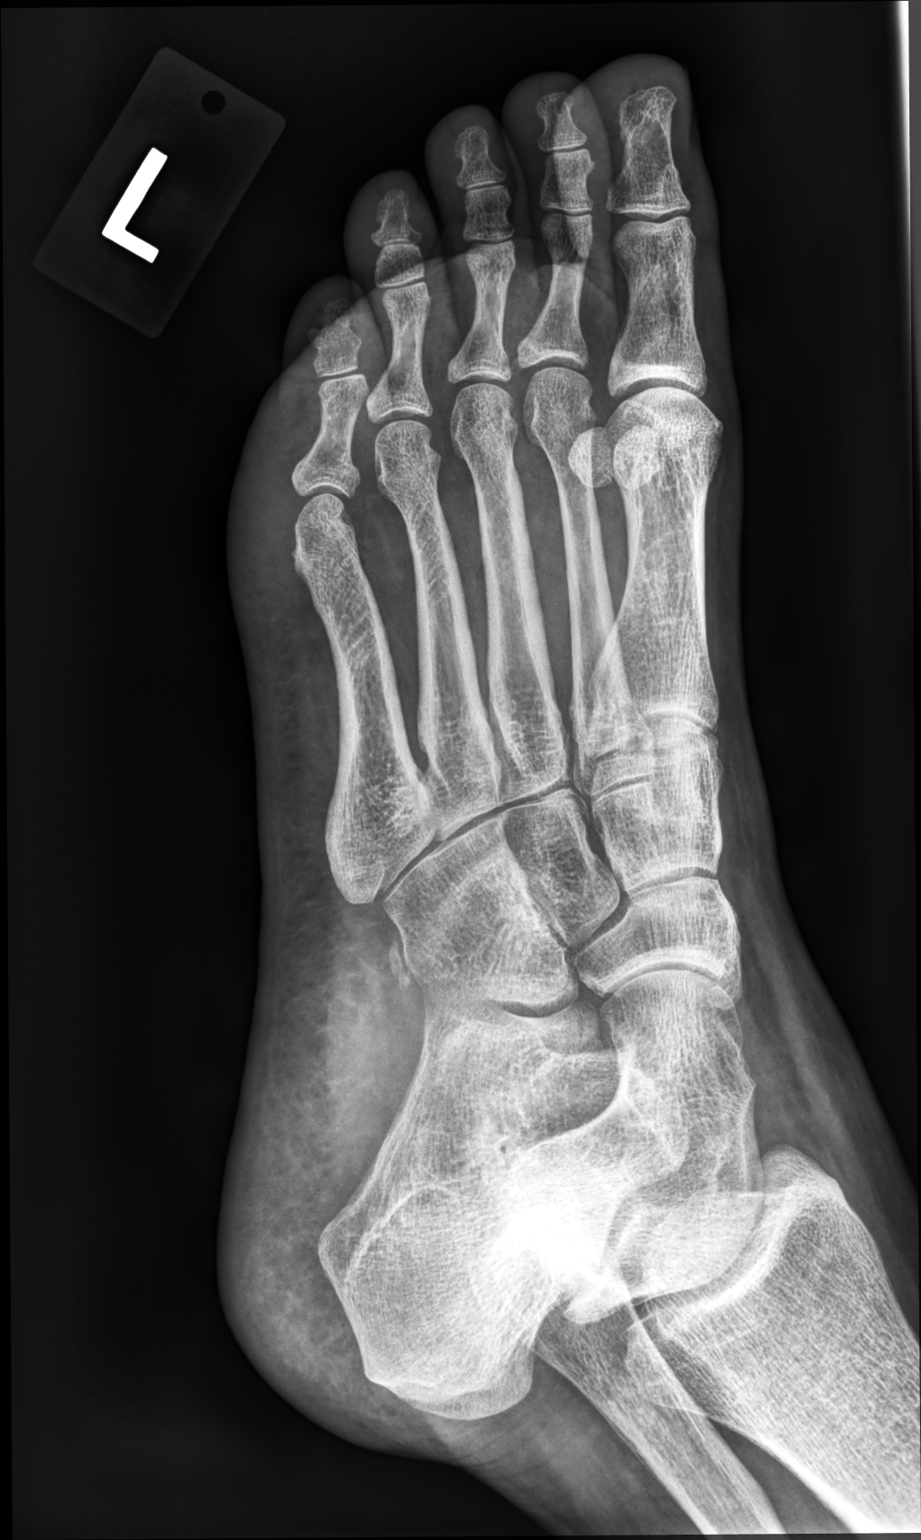

[foot supine lat]
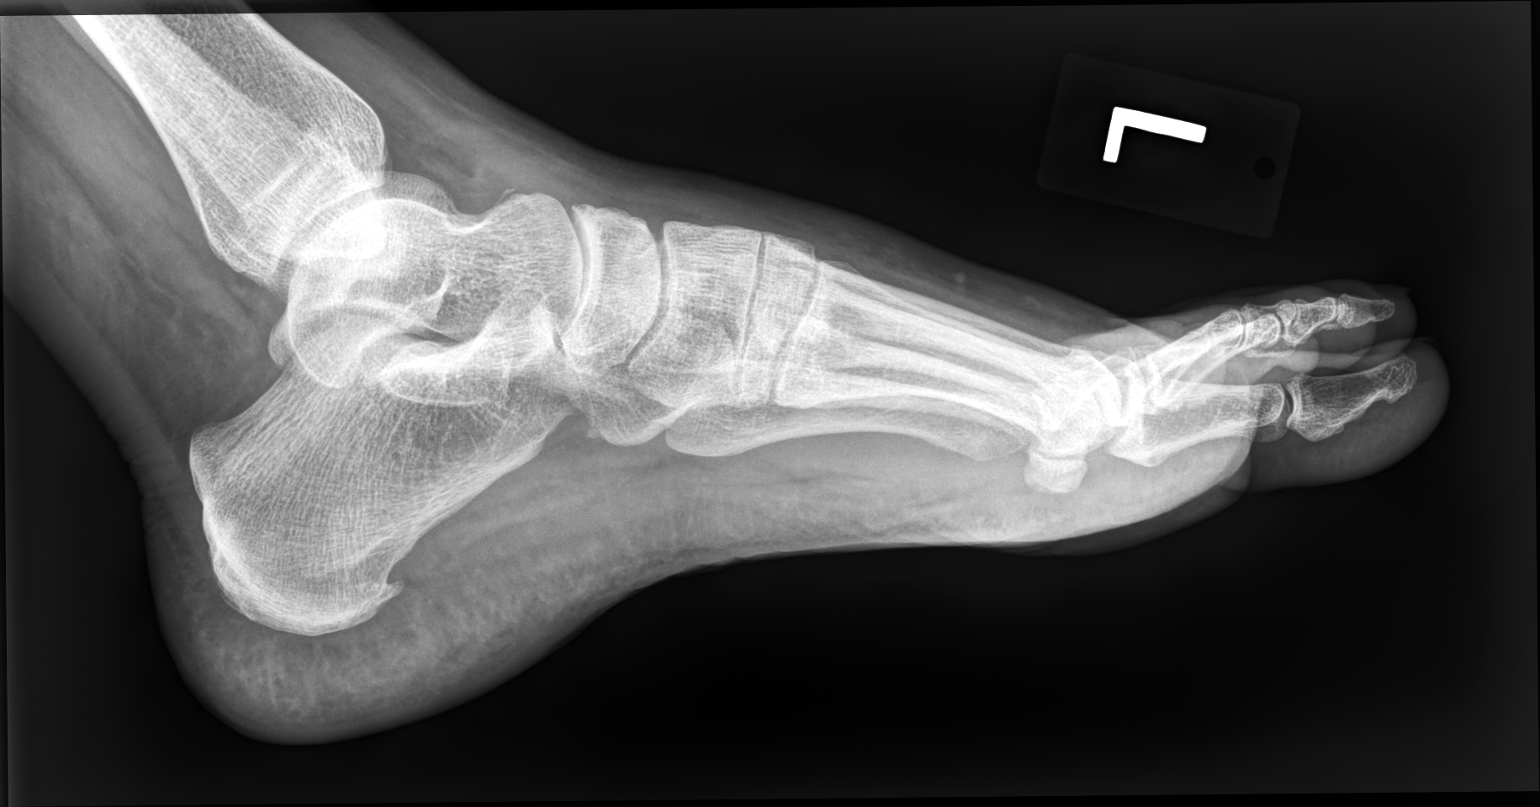

[3 of 3 positions shown; findings below may reference images not displayed]

FINDINGS: There is no evidence of fracture or dislocation. There is no
evidence of arthropathy or other focal bone abnormality. Soft
tissues are unremarkable.
IMPRESSION: Negative.
